# Patient Record
Sex: Female | Born: 1975 | Race: Asian | Hispanic: No | Marital: Married | Smoking: Never smoker
Health system: Southern US, Community
[De-identification: ages and names within clinical notes are randomized; demographics above are authoritative.]

## PROBLEM LIST (undated history)

## (undated) DIAGNOSIS — F32A Depression, unspecified: Secondary | ICD-10-CM

## (undated) DIAGNOSIS — E785 Hyperlipidemia, unspecified: Secondary | ICD-10-CM

## (undated) DIAGNOSIS — F419 Anxiety disorder, unspecified: Secondary | ICD-10-CM

## (undated) DIAGNOSIS — E7800 Pure hypercholesterolemia, unspecified: Secondary | ICD-10-CM

## (undated) DIAGNOSIS — K219 Gastro-esophageal reflux disease without esophagitis: Secondary | ICD-10-CM

## (undated) DIAGNOSIS — E119 Type 2 diabetes mellitus without complications: Secondary | ICD-10-CM

## (undated) HISTORY — DX: Depression, unspecified: F32.A

## (undated) HISTORY — DX: Hyperlipidemia, unspecified: E78.5

## (undated) HISTORY — DX: Gastro-esophageal reflux disease without esophagitis: K21.9

## (undated) HISTORY — DX: Pure hypercholesterolemia, unspecified: E78.00

## (undated) HISTORY — DX: Anxiety disorder, unspecified: F41.9

## (undated) HISTORY — DX: Type 2 diabetes mellitus without complications: E11.9

## (undated) HISTORY — PX: NO PAST SURGERIES: SHX2092

## (undated) HISTORY — PX: OTHER SURGICAL HISTORY: SHX169

---

## 2021-03-13 LAB — HEPATITIS B SURFACE ANTIGEN

## 2021-03-13 LAB — HM HEPATITIS C SCREENING LAB: HM Hepatitis Screen: NEGATIVE

## 2021-03-13 LAB — HM HIV SCREENING LAB: HM HIV Screening: NEGATIVE

## 2021-04-11 ENCOUNTER — Telehealth: Payer: Self-pay

## 2021-04-11 NOTE — Telephone Encounter (Signed)
Refugee from [place]. Language Pashto. Referred from [facility]   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  

## 2021-04-11 NOTE — Progress Notes (Signed)
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

## 2021-04-15 ENCOUNTER — Ambulatory Visit: Payer: Self-pay

## 2021-04-15 DIAGNOSIS — R7612 Nonspecific reaction to cell mediated immunity measurement of gamma interferon antigen response without active tuberculosis: Secondary | ICD-10-CM | POA: Insufficient documentation

## 2021-04-15 NOTE — Progress Notes (Signed)
The information in this document was obtained and reviewed during phone interview with pt.  Pt unsure of ht and wt.  Not using birth control. Is sexually active. She does not want pregnancy; her husband desires for her to get pregnant.  Currently not taking any medications.  Case worker [employer] at [phone]. Pt requests help with getting in touch with case worker about transportation for chest x-ray. Caseworker states they will arrange for transportation and interpreter. Chest x-ray ordered.  Desires LTBI medication.

## 2021-04-15 NOTE — Telephone Encounter (Signed)
Phone call to pt with [employer] interpreter.  Epi completed. Chest x-ray ordered. Case worker to arrange transportation. They are interested in LTBI.  See completed Epi document dated 04/15/21.

## 2021-04-16 ENCOUNTER — Other Ambulatory Visit: Payer: Self-pay

## 2021-04-16 ENCOUNTER — Ambulatory Visit
Admission: RE | Admit: 2021-04-16 | Discharge: 2021-04-16 | Disposition: A | Discharge status: Home / Self Care | Payer: Medicaid Other | Source: Ambulatory Visit | Attending: Family Medicine | Admitting: Family Medicine

## 2021-04-16 DIAGNOSIS — R7612 Nonspecific reaction to cell mediated immunity measurement of gamma interferon antigen response without active tuberculosis: Secondary | ICD-10-CM

## 2021-04-28 ENCOUNTER — Telehealth: Payer: Self-pay

## 2021-04-28 NOTE — Telephone Encounter (Signed)
Phone call to pt with Pashto interpreter with language line. Pt counseled regarding Dr. Stafford Blas review of chest x-ray and order to proceed with LTBI.  Pt to bring any current medications with her to LTBI start visit. As of 04/28/21, not currently taking any medications.  To prevent pregnancy, use condoms with all sex.  LTBI start appt scheduled for 05/13/21.   Case worker to arrange for transportation. Needs Pashto interpreter.

## 2021-04-28 NOTE — Telephone Encounter (Signed)
Caseworker confirmed he would arrange transportation for 05/13/21.

## 2021-04-30 ENCOUNTER — Other Ambulatory Visit: Payer: Self-pay

## 2021-04-30 NOTE — Progress Notes (Signed)
Positive Tspot 03/14/21 Negative HIV 03/13/21 EPI 04/15/21 CXT without Active TB 04/16/21  No meds No significant med hx- painful periods reported (care everywhere)   Rifampin 600mg  x 4 months per [facility]

## 2021-05-03 ENCOUNTER — Ambulatory Visit: Payer: Medicaid Other | Admitting: Internal Medicine

## 2021-05-03 ENCOUNTER — Encounter: Payer: Self-pay | Admitting: Internal Medicine

## 2021-05-03 ENCOUNTER — Other Ambulatory Visit: Payer: Self-pay

## 2021-05-03 VITALS — BP 134/92 | HR 74 | Temp 96.8°F | Ht 62.0 in | Wt 190.4 lb

## 2021-05-03 DIAGNOSIS — N920 Excessive and frequent menstruation with regular cycle: Secondary | ICD-10-CM

## 2021-05-03 DIAGNOSIS — I159 Secondary hypertension, unspecified: Secondary | ICD-10-CM

## 2021-05-03 DIAGNOSIS — N946 Dysmenorrhea, unspecified: Secondary | ICD-10-CM

## 2021-05-03 MED ORDER — IBUPROFEN 600 MG PO TABS
600.0000 mg | ORAL_TABLET | Freq: Three times a day (TID) | ORAL | 0 refills | Status: DC | PRN
Start: 2021-05-03 — End: 2021-11-15

## 2021-05-03 NOTE — Addendum Note (Signed)
Addended by: Aundria Mems AHMAD on: 05/03/2021 12:46 PM   Modules accepted: Orders

## 2021-05-03 NOTE — Progress Notes (Signed)
New Patient Office Visit  Subjective:  Patient ID: Kirsten Mccann, female    DOB: Mar 21, 1976  Age: 45 y.o. MRN: [id number]  CC:  Chief Complaint  Patient presents with   Abdominal Pain    Pt reports sharp abdominal pain and bleeding    Tailbone Pain    HPI Kirsten Mccann presents for new patient evaluation today she c/o chronic lower abd and back pain since she delivered her last baby 7 years ago which may have been a complicated delivery. C/o painful menstruation for which she was prescribed a Rx to which she had an allergic reaction. Also c/o menorrhagia with clots.  Past Medical History:  Diagnosis Date   High cholesterol     Past Surgical History:  Procedure Laterality Date   denies      History reviewed. No pertinent family history.  Social History   Socioeconomic History   Marital status: Married    Spouse name: Not on file   Number of children: Not on file   Years of education: Not on file   Highest education level: Not on file  Occupational History   Not on file  Tobacco Use   Smoking status: Never   Smokeless tobacco: Never  Vaping Use   Vaping Use: Never used  Substance and Sexual Activity   Alcohol use: Never   Drug use: Never   Sexual activity: Yes    Birth control/protection: None  Other Topics Concern   Not on file  Social History Narrative   Not on file   Social Determinants of Health   Financial Resource Strain: Not on file  Food Insecurity: Not on file  Transportation Needs: Not on file  Physical Activity: Not on file  Stress: Not on file  Social Connections: Not on file  Intimate Partner Violence: Not on file    ROS Review of Systems  Constitutional: Negative.   HENT: Negative.    Eyes: Negative.   Respiratory: Negative.    Cardiovascular: Negative.   Gastrointestinal: Negative.   Endocrine: Negative.   Genitourinary:  Positive for menstrual problem and pelvic pain. Negative for dyspareunia and vaginal bleeding.   Musculoskeletal: Negative.   Skin: Negative.   Neurological: Negative.   Psychiatric/Behavioral: Negative.     Objective:   Kirsten Mccann Vitals: BP (!) 134/92 (BP Location: Left Arm, Patient Position: Sitting, Cuff Size: Normal)   Pulse 74   Temp (!) 96.8 F (36 C)   Ht 5\' 2"  (1.575 m)   Wt 190 lb 6.4 oz (86.4 kg)   LMP 04/05/2021 (Approximate) Comment: normal  SpO2 98%   BMI 34.82 kg/m   Physical Exam Constitutional:      Appearance: She is obese. She is not ill-appearing.  HENT:     Head: Normocephalic.  Cardiovascular:     Rate and Rhythm: Normal rate and regular rhythm.     Heart sounds: Normal heart sounds. No murmur heard. Pulmonary:     Effort: Pulmonary effort is normal.     Breath sounds: Normal breath sounds.  Abdominal:     General: Abdomen is protuberant. Bowel sounds are normal. There is no distension.     Palpations: Abdomen is soft.     Tenderness: There is abdominal tenderness in the suprapubic area. There is no rebound.  Skin:    General: Skin is warm and dry.  Neurological:     General: No focal deficit present.  Psychiatric:        Behavior: Behavior normal.    Assessment &  Plan:  Refer to Obgyn, rx nsaids and order labs Problem List Items Addressed This Visit   None   No outpatient encounter medications on file as of 05/03/2021.   No facility-administered encounter medications on file as of 05/03/2021.    Follow-up: No follow-ups on file.   Luna Fuse, MD

## 2021-05-05 ENCOUNTER — Other Ambulatory Visit: Payer: Self-pay | Admitting: Internal Medicine

## 2021-05-06 LAB — CBC WITH DIFFERENTIAL/PLATELET
Basophils Absolute: 0 10*3/uL (ref 0.0–0.2)
Basos: 1 %
EOS (ABSOLUTE): 0.1 10*3/uL (ref 0.0–0.4)
Eos: 1 %
Hematocrit: 37.5 % (ref 34.0–46.6)
Hemoglobin: 12.6 g/dL (ref 11.1–15.9)
Immature Grans (Abs): 0 10*3/uL (ref 0.0–0.1)
Immature Granulocytes: 0 %
Lymphocytes Absolute: 2.2 10*3/uL (ref 0.7–3.1)
Lymphs: 40 %
MCH: 28.2 pg (ref 26.6–33.0)
MCHC: 33.6 g/dL (ref 31.5–35.7)
MCV: 84 fL (ref 79–97)
Monocytes Absolute: 0.3 10*3/uL (ref 0.1–0.9)
Monocytes: 5 %
Neutrophils Absolute: 3 10*3/uL (ref 1.4–7.0)
Neutrophils: 53 %
Platelets: 271 10*3/uL (ref 150–450)
RBC: 4.47 x10E6/uL (ref 3.77–5.28)
RDW: 12 % (ref 11.7–15.4)
WBC: 5.6 10*3/uL (ref 3.4–10.8)

## 2021-05-06 LAB — COMPREHENSIVE METABOLIC PANEL
ALT: 13 IU/L (ref 0–32)
AST: 17 IU/L (ref 0–40)
Albumin/Globulin Ratio: 1.6 (ref 1.2–2.2)
Albumin: 4.5 g/dL (ref 3.8–4.8)
Alkaline Phosphatase: 65 IU/L (ref 44–121)
BUN/Creatinine Ratio: 15 (ref 9–23)
BUN: 9 mg/dL (ref 6–24)
Bilirubin Total: <0.2 mg/dL (ref 0.0–1.2)
CO2: 23 mmol/L (ref 20–29)
Calcium: 9.2 mg/dL (ref 8.7–10.2)
Chloride: 100 mmol/L (ref 96–106)
Creatinine, Ser: 0.6 mg/dL (ref 0.57–1.00)
Globulin, Total: 2.8 g/dL (ref 1.5–4.5)
Glucose: 261 mg/dL — ABNORMAL HIGH (ref 70–99)
Potassium: 4.5 mmol/L (ref 3.5–5.2)
Sodium: 134 mmol/L (ref 134–144)
Total Protein: 7.3 g/dL (ref 6.0–8.5)
eGFR: 114 mL/min/{1.73_m2} (ref >59)

## 2021-05-06 LAB — PT AND PTT
INR: 0.9 (ref 0.9–1.2)
Prothrombin Time: 9.7 s (ref 9.1–12.0)
aPTT: 25 s (ref 24–33)

## 2021-05-06 LAB — LIPID PANEL W/O CHOL/HDL RATIO
Cholesterol, Total: 240 mg/dL — ABNORMAL HIGH (ref 100–199)
HDL: 28 mg/dL — ABNORMAL LOW (ref >39)
LDL Chol Calc (NIH): 89 mg/dL (ref 0–99)
Triglycerides: 743 mg/dL (ref 0–149)
VLDL Cholesterol Cal: 123 mg/dL — ABNORMAL HIGH (ref 5–40)

## 2021-05-06 LAB — FSH/LH
FSH: 4.8 m[IU]/mL
LH: 5 m[IU]/mL

## 2021-05-06 LAB — TSH: TSH: 0.88 u[IU]/mL (ref 0.450–4.500)

## 2021-05-06 LAB — T4, FREE: Free T4: 1 ng/dL (ref 0.82–1.77)

## 2021-05-13 ENCOUNTER — Other Ambulatory Visit: Payer: Self-pay

## 2021-05-13 ENCOUNTER — Ambulatory Visit (LOCAL_COMMUNITY_HEALTH_CENTER): Payer: Medicaid Other

## 2021-05-13 VITALS — Wt 188.5 lb

## 2021-05-13 DIAGNOSIS — R7612 Nonspecific reaction to cell mediated immunity measurement of gamma interferon antigen response without active tuberculosis: Secondary | ICD-10-CM | POA: Diagnosis not present

## 2021-05-13 MED ORDER — RIFAMPIN 300 MG PO CAPS: 600.0000 mg | ORAL_CAPSULE | Freq: Every day | ORAL | 0 refills | Status: AC

## 2021-05-13 NOTE — Progress Notes (Signed)
In Nurse Clinic with husband for TB med start /LTBI / Rifampin #1. Lang line Pashto interpreter (ID [phone]) via phone used today.  LTBI vs Active TB explained . Rifampin med info sheet reviewed and explained to pt to contact [facility] TB coord. Or Efrain Sella, RN with concerns / questions/side effects. Contact cards for Efrain Sella, RN and Phineas Real, RN given to pt and husband.  Pt reports recurrent abd pain that is worse during period, but experiences it in a milder form when not on period. Has appt scheduled with provider on 05/19/2021 for evaluation. Reports taking ibuprofen and indocin for pain management. Denies taking any other meds. On no bcm and advised against preg while taking Rifampin. Condoms given.   - Rifampin 300 mg #60 dispensed today per order by Ralene Bathe, MD dated 04/30/2021 with instructions to take 2 pills daily by mouth for 4 months. To lab for CBC with diff and LFT's.   - Husband reports that caseworker who has been helping them will no longer be able to assist them any longer. Caseworker Kizzie Fantasia [phone]. Explained to pt and husband that [facility] will help to arrange transportation for next TB med appt , scheduled for 06/11/2021 at 9 am. RN placed phone call to caseworker Delana Meyer [place]) who states she will arrange transportation for pt and husband now for their return home. RN walked pt to [facility] lobby on ground floor for them to wait for their ride.   Plan to fax PCP letter (with TB med start) to  [facility] (fax [phone]) (phone [phone]). Jerel Shepherd, RN  05/14/2021  PCP letter faxed successfully to [facility]. Efrain Sella, RN supervisor plans to contact pt and husband before next appt (scheduled 06/11/2021 at 9 am) with transportation details for visit.  -Plan to offer pt Hep A Vaccine at next visit. Per lab result review, Hep B surface Antibody reactive and No Hep B vaccine indicated. Per [facility] review, no documentation of Hep A vaccine. Pt meets  criteria for state supplied Hep A vaccine. Jerel Shepherd, RN

## 2021-05-14 LAB — CBC WITH DIFFERENTIAL/PLATELET
Basophils Absolute: 0.1 10*3/uL (ref 0.0–0.2)
Basos: 1 %
EOS (ABSOLUTE): 0 10*3/uL (ref 0.0–0.4)
Eos: 1 %
Hematocrit: 38 % (ref 34.0–46.6)
Hemoglobin: 12.9 g/dL (ref 11.1–15.9)
Immature Grans (Abs): 0 10*3/uL (ref 0.0–0.1)
Immature Granulocytes: 0 %
Lymphocytes Absolute: 2.5 10*3/uL (ref 0.7–3.1)
Lymphs: 37 %
MCH: 28.1 pg (ref 26.6–33.0)
MCHC: 33.9 g/dL (ref 31.5–35.7)
MCV: 83 fL (ref 79–97)
Monocytes Absolute: 0.4 10*3/uL (ref 0.1–0.9)
Monocytes: 5 %
Neutrophils Absolute: 3.9 10*3/uL (ref 1.4–7.0)
Neutrophils: 56 %
Platelets: 282 10*3/uL (ref 150–450)
RBC: 4.59 x10E6/uL (ref 3.77–5.28)
RDW: 12.3 % (ref 11.7–15.4)
WBC: 6.9 10*3/uL (ref 3.4–10.8)

## 2021-05-14 LAB — HEPATIC FUNCTION PANEL
ALT: 14 IU/L (ref 0–32)
AST: 15 IU/L (ref 0–40)
Albumin: 4.5 g/dL (ref 3.8–4.8)
Alkaline Phosphatase: 54 IU/L (ref 44–121)
Bilirubin Total: 0.3 mg/dL (ref 0.0–1.2)
Bilirubin, Direct: <0.1 mg/dL (ref 0.00–0.40)
Total Protein: 7.4 g/dL (ref 6.0–8.5)

## 2021-05-17 ENCOUNTER — Ambulatory Visit: Payer: Medicaid Other | Admitting: Internal Medicine

## 2021-05-18 NOTE — Progress Notes (Deleted)
    System, Provider Not In   No chief complaint on file.   HPI:      Ms. Kirsten Mccann is a 45 y.o. No obstetric history on file. whose LMP was Patient's last menstrual period was 05/10/2021 (within days)., presents today for NP eval    referreed by PCP    Patient Active Problem List   Diagnosis Date Noted   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  Social History   Socioeconomic History   Marital status: Married    Spouse name: Not on file   Number of children: Not on file   Years of education: Not on file   Highest education level: Not on file  Occupational History   Not on file  Tobacco Use   Smoking status: Never   Smokeless tobacco: Never  Vaping Use   Vaping Use: Never used  Substance and Sexual Activity   Alcohol use: Never   Drug use: Never   Sexual activity: Yes    Birth control/protection: None    Comment: advised to use condoms/condoms given  Other Topics Concern   Not on file  Social History Narrative   Not on file   Social Determinants of Health   Financial Resource Strain: Not on file  Food Insecurity: Not on file  Transportation Needs: Not on file  Physical Activity: Not on file  Stress: Not on file  Social Connections: Not on file  Intimate Partner Violence: Not on file    Outpatient Medications Prior to Visit  Medication Sig Dispense Refill   ibuprofen (ADVIL) 600 MG tablet Take 1 tablet (600 mg total) by mouth every 8 (eight) hours as needed for cramping (menstrual pain). 60 tablet 0   rifampin (RIFADIN) 300 MG capsule Take 2 capsules (600 mg total) by mouth daily. 60 capsule 0   No facility-administered medications prior to visit.      ROS:  Review of Systems BREAST: No symptoms   OBJECTIVE:   Vitals:  LMP 05/10/2021 (Within Days)   Physical Exam  Results: No results found for this or any previous visit (from the  past 24 hour(s)).   Assessment/Plan: No diagnosis found.    No orders of the defined types were placed in this encounter.     No follow-ups on file.  Gidget Quizhpi B. Song Garris, PA-C 05/18/2021 6:39 PM

## 2021-05-19 ENCOUNTER — Encounter: Payer: Medicaid Other | Admitting: Obstetrics and Gynecology

## 2021-05-20 ENCOUNTER — Encounter: Payer: Medicaid Other | Admitting: Obstetrics and Gynecology

## 2021-05-23 ENCOUNTER — Telehealth: Payer: Self-pay

## 2021-05-23 NOTE — Telephone Encounter (Signed)
TC from patient re: Rifampin side effects.  Patient's husband explained that he and his wife are still having stomach ache, dizziness, headache and can't eat anything because of the Rifampin even after taking medication with food.  Denies jaundice, vomiting and diarrhea. States she is having painful periods d/t Rifampin. TB RN consulted with Dr. Alvester Morin.  Stomach ache and other complaints could be from patient's new environment, different diet, previous c/o form painful periods when they arrived a few months, etc.  They could also have a virus. Patient c/o stomach bloating and can't eat anything.  Taxi arranged for patient and husband to come in for labs.  If labs are normal will start INH x 6 months for LTBI. Patient verbalized understanding and agrees with plan Richmond Campbell, RN

## 2021-05-26 ENCOUNTER — Other Ambulatory Visit (LOCAL_COMMUNITY_HEALTH_CENTER): Payer: Self-pay

## 2021-05-26 ENCOUNTER — Other Ambulatory Visit: Payer: Medicaid Other

## 2021-05-26 DIAGNOSIS — R7612 Nonspecific reaction to cell mediated immunity measurement of gamma interferon antigen response without active tuberculosis: Secondary | ICD-10-CM

## 2021-05-26 NOTE — Progress Notes (Signed)
Patient in clinic this afternoon for CBC and LFTs. Patient and spouse are still complaining of abdominal pain, unable to eat, headache and patient is c/o painful periods and having 2 periods this month. Will draw labs and call patient back with results.   Patient's husband requests a GYN referral for the patient and her painful periods. They also request a vaccine appt for January.  Richmond Campbell, RN

## 2021-05-27 LAB — CBC WITH DIFFERENTIAL/PLATELET
Basophils Absolute: 0 10*3/uL (ref 0.0–0.2)
Basos: 1 %
EOS (ABSOLUTE): 0.1 10*3/uL (ref 0.0–0.4)
Eos: 1 %
Hematocrit: 39.6 % (ref 34.0–46.6)
Hemoglobin: 13 g/dL (ref 11.1–15.9)
Immature Grans (Abs): 0 10*3/uL (ref 0.0–0.1)
Immature Granulocytes: 0 %
Lymphocytes Absolute: 2.4 10*3/uL (ref 0.7–3.1)
Lymphs: 37 %
MCH: 27.1 pg (ref 26.6–33.0)
MCHC: 32.8 g/dL (ref 31.5–35.7)
MCV: 83 fL (ref 79–97)
Monocytes Absolute: 0.4 10*3/uL (ref 0.1–0.9)
Monocytes: 6 %
Neutrophils Absolute: 3.5 10*3/uL (ref 1.4–7.0)
Neutrophils: 55 %
Platelets: 289 10*3/uL (ref 150–450)
RBC: 4.8 x10E6/uL (ref 3.77–5.28)
RDW: 12.1 % (ref 11.7–15.4)
WBC: 6.4 10*3/uL (ref 3.4–10.8)

## 2021-05-27 LAB — HEPATIC FUNCTION PANEL
ALT: 12 IU/L (ref 0–32)
AST: 13 IU/L (ref 0–40)
Albumin: 4.5 g/dL (ref 3.8–4.8)
Alkaline Phosphatase: 61 IU/L (ref 44–121)
Bilirubin Total: <0.2 mg/dL (ref 0.0–1.2)
Bilirubin, Direct: 0.08 mg/dL (ref 0.00–0.40)
Total Protein: 7.7 g/dL (ref 6.0–8.5)

## 2021-05-30 ENCOUNTER — Telehealth: Payer: Self-pay

## 2021-06-02 NOTE — Telephone Encounter (Signed)
TC with patient.  Informed patient labs were normal. Patient states she doesn't feel well. Explained Rifampin is not the cause of not feeling well. Offered INH x 6 months to patient.  She declines LTBI tx at this time.  Husband and patient  feel that he and his wife need to get some other health issues worked out first. Advice worker to call TB RN if changes mind. Richmond Campbell, RN

## 2021-08-15 ENCOUNTER — Telehealth: Payer: Self-pay

## 2021-08-16 ENCOUNTER — Other Ambulatory Visit: Payer: Self-pay

## 2021-08-16 ENCOUNTER — Ambulatory Visit: Payer: Medicaid Other | Admitting: Internal Medicine

## 2021-08-16 ENCOUNTER — Encounter: Payer: Self-pay | Admitting: Adult Health

## 2021-08-16 VITALS — BP 120/80 | HR 67 | Temp 98.2°F | Ht 64.0 in | Wt 181.6 lb

## 2021-08-16 DIAGNOSIS — E782 Mixed hyperlipidemia: Secondary | ICD-10-CM

## 2021-08-16 DIAGNOSIS — R739 Hyperglycemia, unspecified: Secondary | ICD-10-CM

## 2021-08-16 NOTE — Progress Notes (Signed)
? ?Established Patient Office Visit ? ?Subjective:  ?Patient ID: Barabara Motz, female    DOB: August 20, 1975  Age: 46 y.o. MRN: [id number] ? ?CC:  ?Chief Complaint  ?Patient presents with  ? Follow-up  ?  Pt reports severe stomach pain before starting her period and after. Pain has been going on for over a year.They are here today because of their abnormal lab.   ? ? ?HPI ?Voncile Zimmermann presents for lab results but failed to have tests done.  ? ?Past Medical History:  ?Diagnosis Date  ? High cholesterol   ? ? ?Past Surgical History:  ?Procedure Laterality Date  ? denies    ? ? ?History reviewed. No pertinent family history. ? ?Social History  ? ?Socioeconomic History  ? Marital status: Married  ?  Spouse name: Not on file  ? Number of children: Not on file  ? Years of education: Not on file  ? Highest education level: Not on file  ?Occupational History  ? Not on file  ?Tobacco Use  ? Smoking status: Never  ? Smokeless tobacco: Never  ?Vaping Use  ? Vaping Use: Never used  ?Substance and Sexual Activity  ? Alcohol use: Never  ? Drug use: Never  ? Sexual activity: Yes  ?  Birth control/protection: None  ?  Comment: advised to use condoms/condoms given  ?Other Topics Concern  ? Not on file  ?Social History Narrative  ? Not on file  ? ?Social Determinants of Health  ? ?Financial Resource Strain: Not on file  ?Food Insecurity: Not on file  ?Transportation Needs: Not on file  ?Physical Activity: Not on file  ?Stress: Not on file  ?Social Connections: Not on file  ?Intimate Partner Violence: Not on file  ? ? ?Outpatient Medications Prior to Visit  ?Medication Sig Dispense Refill  ? ibuprofen (ADVIL) 600 MG tablet Take 1 tablet (600 mg total) by mouth every 8 (eight) hours as needed for cramping (menstrual pain). 60 tablet 0  ? ?No facility-administered medications prior to visit.  ? ? ?No Known Allergies ? ?ROS ?Review of Systems ? ?  ?Objective:  ?  ?Physical Exam ?Constitutional:   ?   Appearance: Normal appearance.  ?HENT:  ?    Head: Normocephalic.  ?Cardiovascular:  ?   Rate and Rhythm: Normal rate and regular rhythm.  ?   Heart sounds: Normal heart sounds. No murmur heard. ?Pulmonary:  ?   Effort: No respiratory distress.  ?   Breath sounds: Normal breath sounds.  ?Neurological:  ?   Mental Status: She is alert.  ? ? ?BP 120/80 (BP Location: Left Arm, Patient Position: Sitting, Cuff Size: Normal)   Pulse 67   Temp 98.2 ?F (36.8 ?C) (Oral)   Ht '5\' 4"'  (1.626 m)   Wt 181 lb 9.6 oz (82.4 kg)   SpO2 98%   BMI 31.17 kg/m?  ?Wt Readings from Last 3 Encounters:  ?08/16/21 181 lb 9.6 oz (82.4 kg)  ?05/13/21 188 lb 8 oz (85.5 kg)  ?05/03/21 190 lb 6.4 oz (86.4 kg)  ? ? ? ?Health Maintenance Due  ?Topic Date Due  ? COVID-19 Vaccine (1) Never done  ? PAP SMEAR-Modifier  Never done  ? ? ?There are no preventive care reminders to display for this patient. ? ?Lab Results  ?Component Value Date  ? TSH 0.880 05/05/2021  ? ?Lab Results  ?Component Value Date  ? WBC 6.4 05/26/2021  ? HGB 13.0 05/26/2021  ? HCT 39.6 05/26/2021  ? MCV  83 05/26/2021  ? PLT 289 05/26/2021  ? ?Lab Results  ?Component Value Date  ? NA 134 05/05/2021  ? K 4.5 05/05/2021  ? CO2 23 05/05/2021  ? GLUCOSE 261 (H) 05/05/2021  ? BUN 9 05/05/2021  ? CREATININE 0.60 05/05/2021  ? BILITOT <0.2 05/26/2021  ? ALKPHOS 61 05/26/2021  ? AST 13 05/26/2021  ? ALT 12 05/26/2021  ? PROT 7.7 05/26/2021  ? ALBUMIN 4.5 05/26/2021  ? CALCIUM 9.2 05/05/2021  ? EGFR 114 05/05/2021  ? ?Lab Results  ?Component Value Date  ? CHOL 240 (H) 05/05/2021  ? ?Lab Results  ?Component Value Date  ? HDL 28 (L) 05/05/2021  ? ?Lab Results  ?Component Value Date  ? [place] 89 05/05/2021  ? ?Lab Results  ?Component Value Date  ? TRIG 743 ([place]) 05/05/2021  ? ?No results found for: CHOLHDL ?No results found for: HGBA1C ? ?  ?Assessment & Plan:  ?Given low cholesterol  handout ?Recheck cholesterol and order a1c ?Problem List Items Addressed This Visit   ?None ? ? ?No orders of the defined types were placed in this  encounter. ? ? ?Follow-up: No follow-ups on file.  ? ? ?Volanda Napoleon, MD ?

## 2021-09-02 ENCOUNTER — Other Ambulatory Visit: Payer: Self-pay | Admitting: Internal Medicine

## 2021-09-03 LAB — LIPID PANEL W/O CHOL/HDL RATIO
Cholesterol, Total: 183 mg/dL (ref 100–199)
HDL: 34 mg/dL — ABNORMAL LOW (ref >39)
LDL Chol Calc (NIH): 117 mg/dL — ABNORMAL HIGH (ref 0–99)
Triglycerides: 181 mg/dL — ABNORMAL HIGH (ref 0–149)
VLDL Cholesterol Cal: 32 mg/dL (ref 5–40)

## 2021-09-03 LAB — HGB A1C W/O EAG: Hgb A1c MFr Bld: 7.8 % — ABNORMAL HIGH (ref 4.8–5.6)

## 2021-09-13 ENCOUNTER — Ambulatory Visit: Payer: Medicaid Other | Admitting: Internal Medicine

## 2021-09-13 ENCOUNTER — Encounter: Payer: Self-pay | Admitting: Internal Medicine

## 2021-09-13 VITALS — BP 132/81 | HR 97 | Temp 98.5°F | Ht 62.32 in | Wt 191.8 lb

## 2021-09-13 DIAGNOSIS — B078 Other viral warts: Secondary | ICD-10-CM

## 2021-09-13 DIAGNOSIS — N946 Dysmenorrhea, unspecified: Secondary | ICD-10-CM

## 2021-09-13 DIAGNOSIS — Z227 Latent tuberculosis: Secondary | ICD-10-CM

## 2021-09-13 DIAGNOSIS — E1165 Type 2 diabetes mellitus with hyperglycemia: Secondary | ICD-10-CM

## 2021-09-13 MED ORDER — METFORMIN HCL 500 MG PO TABS: 500.0000 mg | ORAL_TABLET | Freq: Two times a day (BID) | ORAL | 3 refills | Status: DC

## 2021-09-13 NOTE — Progress Notes (Signed)
[facility]  ? ?Internal MEDICINE  ?Office Visit Note ? ?Patient Name: Kirsten Mccann ? [id number]  ?[id number] ? ?Date of Service: 09/13/2021 ? ?Chief Complaint  ?Patient presents with  ? Follow-up  ?  Blood sugar. Patient reports to have fever twice a day, and full body ache and knee pain  ? ? ?HPI ? ?Patient is here with her husband and they refuses from the family's and cannot speak [place] Arabic or Urdu ?There is a communication barrier, most of the conversation is done by her husband she stayed quietly in room. ?Patient is here to discuss her lab work her blood sugar is elevated with a hemoglobin A1c of 7.8 ?Her triglycerides are also mildly elevated, she has 7 kids 4 of them are back in [place], 3 of them  are here  ?Her husband is unable to find a job. ?Patient is also complains of dysmenorrhea especially before cycles she gets a lot of cramping she does have an appointment with OB/GYN at [facility]. ?Patient is also has some multiple growths over her hands  one of them is around the nail and is very painful ?Most of the time she stays home patient seems to be depressed ?She did not finish her treatment for her latent TB ?Current Medication: ?Outpatient Encounter Medications as of 09/13/2021  ?Medication Sig  ? metFORMIN (GLUCOPHAGE) 500 MG tablet Take 1 tablet (500 mg total) by mouth 2 (two) times daily with a meal. Before breakfast and before dinner  ? ibuprofen (ADVIL) 600 MG tablet Take 1 tablet (600 mg total) by mouth every 8 (eight) hours as needed for cramping (menstrual pain).  ? ?No facility-administered encounter medications on file as of 09/13/2021.  ? ? ?Surgical History: ?Past Surgical History:  ?Procedure Laterality Date  ? denies    ? ? ?Medical History: ?Past Medical History:  ?Diagnosis Date  ? High cholesterol   ? ? ?Family History: ?History reviewed. No pertinent family history. ? ?Social History  ? ?Socioeconomic History  ? Marital status: Married  ?  Spouse name: Not on file  ? Number of  children: Not on file  ? Years of education: Not on file  ? Highest education level: Not on file  ?Occupational History  ? Not on file  ?Tobacco Use  ? Smoking status: Never  ? Smokeless tobacco: Never  ?Vaping Use  ? Vaping Use: Never used  ?Substance and Sexual Activity  ? Alcohol use: Never  ? Drug use: Never  ? Sexual activity: Yes  ?  Birth control/protection: None  ?  Comment: advised to use condoms/condoms given  ?Other Topics Concern  ? Not on file  ?Social History Narrative  ? Not on file  ? ?Social Determinants of Health  ? ?Financial Resource Strain: Not on file  ?Food Insecurity: Not on file  ?Transportation Needs: Not on file  ?Physical Activity: Not on file  ?Stress: Not on file  ?Social Connections: Not on file  ?Intimate Partner Violence: Not on file  ? ? ? ? ?Review of Systems  ?Constitutional:  Negative for chills, fatigue and unexpected weight change.  ?HENT:  Positive for postnasal drip. Negative for congestion, rhinorrhea, sneezing and sore throat.   ?Eyes:  Negative for redness.  ?Respiratory:  Negative for cough, chest tightness and shortness of breath.   ?Cardiovascular:  Negative for chest pain and palpitations.  ?Gastrointestinal:  Negative for abdominal pain, constipation, diarrhea, nausea and vomiting.  ?Genitourinary:  Positive for menstrual problem and pelvic pain. Negative for  dysuria and frequency.  ?Musculoskeletal:  Negative for arthralgias, back pain, joint swelling and neck pain.  ?Skin:  Positive for color change and rash.  ?Neurological: Negative.  Negative for tremors and numbness.  ?Hematological:  Negative for adenopathy. Does not bruise/bleed easily.  ?Psychiatric/Behavioral:  Negative for behavioral problems (Depression), sleep disturbance and suicidal ideas. The patient is not nervous/anxious.   ? ?Vital Signs: ?BP 132/81 (BP Location: Left Arm, Patient Position: Sitting, Cuff Size: Normal)   Pulse 97   Temp 98.5 ?F (36.9 ?C) (Oral)   Ht 5' 2.32" (1.583 m)   Wt 191 lb  12.8 oz (87 kg)   SpO2 97%   BMI 34.72 kg/m?  ? ? ?Physical Exam ?Skin: ?   General: Skin is warm.  ?   Comments: Multiple warts over her hand   ? ?Assessment/Plan: ?1. Other viral warts ?Multiple warts over her hand and under the nail which is causing her to have pain patient is to be seen by dermatology for further management and treatment ?- Ambulatory referral to Dermatology ? ?2. Poorly controlled diabetes mellitus ([place]) ?Unable to give directions with the glucometer we will start metformin twice a day patient instructed to limit her sweets and also exercise on a regular basis like walking 30 minutes 5 days of the week ?- metFORMIN (GLUCOPHAGE) 500 MG tablet; Take 1 tablet (500 mg total) by mouth 2 (two) times daily with a meal. Before breakfast and before dinner  Dispense: 180 tablet; Refill: 3 ? ?3. Dysmenorrhea ?This will be managed by Gaspar Cola the patient has an appointment on the 13th ? ?4. LTBI (latent tuberculosis infection) ?Encourage patient to follow-up with health department so she can finish her treatment especially if patient is having low-grade fever in p.m. however she denies having any night sweats or weight loss ? ?General Counseling: Ekaterini verbalizes understanding of the findings of todays visit and agrees with plan of treatment. I have discussed any further diagnostic evaluation that may be needed or ordered today. We also reviewed her medications today. she has been encouraged to call the office with any questions or concerns that should arise related to todays visit. ? ? ? ?Orders Placed This Encounter  ?Procedures  ? Ambulatory referral to Dermatology  ? ? ?Meds ordered this encounter  ?Medications  ? metFORMIN (GLUCOPHAGE) 500 MG tablet  ?  Sig: Take 1 tablet (500 mg total) by mouth 2 (two) times daily with a meal. Before breakfast and before dinner  ?  Dispense:  180 tablet  ?  Refill:  3  ? ? ?Total time spent:25 Minutes ?Time spent includes review of chart, medications, test  results, and follow up plan with the patient.  ? ?[place] Controlled Substance Database was reviewed by me. ? ? ?Dr Lavera Guise ?Internal medicine  ?

## 2021-10-18 ENCOUNTER — Ambulatory Visit: Payer: Medicaid Other | Admitting: Internal Medicine

## 2021-11-01 ENCOUNTER — Encounter: Payer: Self-pay | Admitting: Internal Medicine

## 2021-11-01 ENCOUNTER — Ambulatory Visit: Payer: Medicaid Other | Admitting: Internal Medicine

## 2021-11-01 VITALS — BP 112/79 | HR 74 | Temp 98.3°F | Ht 62.0 in | Wt 179.2 lb

## 2021-11-01 DIAGNOSIS — F4323 Adjustment disorder with mixed anxiety and depressed mood: Secondary | ICD-10-CM

## 2021-11-01 DIAGNOSIS — N946 Dysmenorrhea, unspecified: Secondary | ICD-10-CM

## 2021-11-01 DIAGNOSIS — E1165 Type 2 diabetes mellitus with hyperglycemia: Secondary | ICD-10-CM

## 2021-11-01 MED ORDER — SERTRALINE HCL 50 MG PO TABS: ORAL_TABLET | ORAL | 3 refills | Status: DC

## 2021-11-01 MED ORDER — DAPAGLIFLOZIN PROPANEDIOL 5 MG PO TABS: 5.0000 mg | ORAL_TABLET | Freq: Every day | ORAL | 1 refills | Status: DC

## 2021-11-01 MED ORDER — ACETAMINOPHEN ER 650 MG PO TBCR: EXTENDED_RELEASE_TABLET | ORAL | 1 refills | Status: DC

## 2021-11-01 NOTE — Progress Notes (Signed)
[facility]  [place], [place] [address]  Internal MEDICINE  Office Visit Note  Patient Name: Kirsten Mccann  [id number]  [id number]  Date of Service: 11/10/2021  Chief Complaint  Patient presents with   Abdominal Pain    Patient is having a side effect from taking a medicine, which causes her a stomach pain as a side effect     HPI  Patient is recent immigrant from [place], Is here with her son, there is language barrier. She consents to having a side effect abdominal pain due to metformin intake for her diabetes, patient is able to lose about 10 pounds and has been watching her diet. Patient was also having dysmenorrhea and was seen by OB/GYN most of the test and diagnostic has been negative. Patient also feels feverish in the afternoon there has been a question of TB but according to her husband she was treated. She gives history of aches and pains as well unable to do the depression screening however all the circumstances in her life go along with having situational depression   Current Medication: Outpatient Encounter Medications as of 11/01/2021  Medication Sig   acetaminophen (TYLENOL) 650 MG CR tablet Take one tab po qd prn for fever   dapagliflozin propanediol (FARXIGA) 5 MG TABS tablet Take 1 tablet (5 mg total) by mouth daily before breakfast.   sertraline (ZOLOFT) 50 MG tablet Take one tab po qd for depression at lunch   ibuprofen (ADVIL) 600 MG tablet Take 1 tablet (600 mg total) by mouth every 8 (eight) hours as needed for cramping (menstrual pain).   [DISCONTINUED] metFORMIN (GLUCOPHAGE) 500 MG tablet Take 1 tablet (500 mg total) by mouth 2 (two) times daily with a meal. Before breakfast and before dinner   No facility-administered encounter medications on file as of 11/01/2021.    Surgical History: Past Surgical History:  Procedure Laterality Date   denies      Medical History: Past Medical History:  Diagnosis Date   High cholesterol     Family  History: History reviewed. No pertinent family history.  Social History   Socioeconomic History   Marital status: Married    Spouse name: Not on file   Number of children: Not on file   Years of education: Not on file   Highest education level: Not on file  Occupational History   Not on file  Tobacco Use   Smoking status: Never   Smokeless tobacco: Never  Vaping Use   Vaping Use: Never used  Substance and Sexual Activity   Alcohol use: Never   Drug use: Never   Sexual activity: Yes    Birth control/protection: None    Comment: advised to use condoms/condoms given  Other Topics Concern   Not on file  Social History Narrative   Not on file   Social Determinants of Health   Financial Resource Strain: Not on file  Food Insecurity: Not on file  Transportation Needs: Not on file  Physical Activity: Not on file  Stress: Not on file  Social Connections: Not on file  Intimate Partner Violence: Not on file      Review of Systems  Constitutional:  Positive for fever. Negative for fatigue.  HENT:  Negative for congestion, mouth sores and postnasal drip.   Respiratory:  Negative for cough.   Cardiovascular:  Negative for chest pain.  Genitourinary:  Positive for menstrual problem. Negative for flank pain.  Neurological:  Positive for weakness.  Psychiatric/Behavioral: Negative.     Vital  Signs: BP 112/79 (BP Location: Right Arm, Patient Position: Sitting, Cuff Size: Normal)   Pulse 74   Temp 98.3 F (36.8 C)   Ht 5\' 2"  (1.575 m)   Wt 179 lb 3.2 oz (81.3 kg)   SpO2 98%   BMI 32.78 kg/m    Physical Exam Constitutional:      Appearance: Normal appearance.  HENT:     Head: Normocephalic and atraumatic.     Nose: Nose normal.     Mouth/Throat:     Mouth: Mucous membranes are moist.     Pharynx: No posterior oropharyngeal erythema.  Eyes:     Extraocular Movements: Extraocular movements intact.     Pupils: Pupils are equal, round, and reactive to light.   Cardiovascular:     Pulses: Normal pulses.     Heart sounds: Normal heart sounds.  Pulmonary:     Effort: Pulmonary effort is normal.     Breath sounds: Normal breath sounds.  Neurological:     General: No focal deficit present.     Mental Status: She is alert.  Psychiatric:        Mood and Affect: Mood normal.        Behavior: Behavior normal.       Assessment/Plan: 1. Poorly controlled diabetes mellitus (HCC) Metformin stopped due to side effects we will start continue to watch her diet for sweets and increase physical activity as tolerated - dapagliflozin propanediol (FARXIGA) 5 MG TABS tablet; Take 1 tablet (5 mg total) by mouth daily before breakfast.  Dispense: 90 tablet; Refill: 1  2. Dysmenorrhea Work-up from OB/GYN is negative for any acute pathology patient is started on on Tylenol as needed, avoid Advil - acetaminophen (TYLENOL) 650 MG CR tablet; Take one tab po qd prn for fever  Dispense: 30 tablet; Refill: 1  3. Adjustment reaction with anxiety and depression Patient is in new surroundings, [place] all her family is back in [place], this can lead to anxiety and depression we will start on low-dose Zoloft - sertraline (ZOLOFT) 50 MG tablet; Take one tab po qd for depression at lunch  Dispense: 90 tablet; Refill: 3   General Counseling: Tekeshia verbalizes understanding of the findings of todays visit and agrees with plan of treatment. I have discussed any further diagnostic evaluation that may be needed or ordered today. We also reviewed her medications today. she has been encouraged to call the office with any questions or concerns that should arise related to todays visit.    No orders of the defined types were placed in this encounter.   Meds ordered this encounter  Medications   dapagliflozin propanediol (FARXIGA) 5 MG TABS tablet    Sig: Take 1 tablet (5 mg total) by mouth daily before breakfast.    Dispense:  90 tablet    Refill:   1   acetaminophen (TYLENOL) 650 MG CR tablet    Sig: Take one tab po qd prn for fever    Dispense:  30 tablet    Refill:  1   sertraline (ZOLOFT) 50 MG tablet    Sig: Take one tab po qd for depression at lunch    Dispense:  90 tablet    Refill:  3    Total time spent:30 Minutes Time spent includes review of chart, medications, test results, and follow up plan with the patient.   [place] Controlled Substance Database was reviewed by me.   Dr [place] Internal medicine

## 2021-11-15 ENCOUNTER — Ambulatory Visit: Payer: Medicaid Other | Admitting: Internal Medicine

## 2021-11-15 VITALS — BP 126/73 | HR 52 | Temp 98.1°F

## 2021-11-15 DIAGNOSIS — K219 Gastro-esophageal reflux disease without esophagitis: Secondary | ICD-10-CM

## 2021-11-15 DIAGNOSIS — G8929 Other chronic pain: Secondary | ICD-10-CM

## 2021-11-15 DIAGNOSIS — F341 Dysthymic disorder: Secondary | ICD-10-CM

## 2021-11-15 DIAGNOSIS — E1165 Type 2 diabetes mellitus with hyperglycemia: Secondary | ICD-10-CM

## 2021-11-15 NOTE — Progress Notes (Signed)
[facility] [place], [place] [address]  Internal MEDICINE  Office Visit Note  Patient Name: Kirsten Mccann  [id number]  [id number]  Date of Service: 11/15/2021  Chief Complaint  Patient presents with   Abdominal Pain    Has been getting worse   Depression   Diabetes    HPI  Patient is here with her husband, she is an immigrant from [place]. There is language barrier. Patient has been seen multiple times for abdominal pain which is getting worse, likely due to menstrual cramps however it goes on for the whole month worse around her cycle time she denies any constipation was seen at [facility] and had pelvic exam with biopsy which was negative. Patient also has new onset diabetes was started on metformin which made her abdominal pain and gastritis worse, she was unable to afford any other medications at this time. Patient also was started on Zoloft on previous visit she has been tolerating it well patient does complain of heartburn as well Current Medication: Outpatient Encounter Medications as of 11/15/2021  Medication Sig   sertraline (ZOLOFT) 50 MG tablet Take one tab po qd for depression at lunch   [DISCONTINUED] acetaminophen (TYLENOL) 650 MG CR tablet Take one tab po qd prn for fever   [DISCONTINUED] dapagliflozin propanediol (FARXIGA) 5 MG TABS tablet Take 1 tablet (5 mg total) by mouth daily before breakfast.   [DISCONTINUED] ibuprofen (ADVIL) 600 MG tablet Take 1 tablet (600 mg total) by mouth every 8 (eight) hours as needed for cramping (menstrual pain).   No facility-administered encounter medications on file as of 11/15/2021.    Surgical History: Past Surgical History:  Procedure Laterality Date   denies      Medical History: Past Medical History:  Diagnosis Date   High cholesterol     Family History: No family history on file.  Social History   Socioeconomic History   Marital status: Married    Spouse name: Not on file   Number of children: Not on  file   Years of education: Not on file   Highest education level: Not on file  Occupational History   Not on file  Tobacco Use   Smoking status: Never   Smokeless tobacco: Never  Vaping Use   Vaping Use: Never used  Substance and Sexual Activity   Alcohol use: Never   Drug use: Never   Sexual activity: Yes    Birth control/protection: None    Comment: advised to use condoms/condoms given  Other Topics Concern   Not on file  Social History Narrative   Not on file   Social Determinants of Health   Financial Resource Strain: Not on file  Food Insecurity: Not on file  Transportation Needs: Not on file  Physical Activity: Not on file  Stress: Not on file  Social Connections: Not on file  Intimate Partner Violence: Not on file      Review of Systems  Constitutional:  Negative for fatigue and fever.  HENT:  Negative for congestion, mouth sores and postnasal drip.   Respiratory:  Negative for cough.   Cardiovascular:  Negative for chest pain.  Gastrointestinal:  Positive for abdominal pain and nausea. Negative for diarrhea.  Genitourinary:  Negative for flank pain.  Psychiatric/Behavioral: Negative.     Vital Signs: BP 126/73 (BP Location: Right Arm, Patient Position: Sitting, Cuff Size: Normal)   Pulse (!) 52   Temp 98.1 F (36.7 C) (Oral)    Physical Exam Constitutional:  Appearance: Normal appearance.  HENT:     Head: Normocephalic and atraumatic.     Nose: Nose normal.     Mouth/Throat:     Mouth: Mucous membranes are moist.     Pharynx: No posterior oropharyngeal erythema.  Eyes:     Extraocular Movements: Extraocular movements intact.     Pupils: Pupils are equal, round, and reactive to light.  Cardiovascular:     Pulses: Normal pulses.     Heart sounds: Normal heart sounds.  Pulmonary:     Effort: Pulmonary effort is normal.     Breath sounds: Normal breath sounds.  Abdominal:     General: Bowel sounds are increased.     Tenderness: There is  abdominal tenderness in the suprapubic area and left upper quadrant.  Neurological:     General: No focal deficit present.     Mental Status: She is alert.  Psychiatric:        Mood and Affect: Mood normal.        Behavior: Behavior normal.       Assessment/Plan: 1. Chronic abdominal pain Plan patient continues to have chronic abdominal pain which is getting worse questionable lower dysmenorrhea however we will do CT abdomen and pelvis and treat her afterwards - CT Abdomen Pelvis W Contrast; Future  2. Gastroesophageal reflux disease without esophagitis Samples of omeprazole 20 mg once a day are given to the patient  3. Type II diabetes mellitus with hyperosmolarity, uncontrolled (HCC) We will stop metformin due to side effect of patient is unable to afford any of the medication she will control with lifestyle modification  4. Persistent depressive disorder Continue Zoloft as before  General Counseling: Kirsten Mccann verbalizes understanding of the findings of todays visit and agrees with plan of treatment. I have discussed any further diagnostic evaluation that may be needed or ordered today. We also reviewed her medications today. she has been encouraged to call the office with any questions or concerns that should arise related to todays visit.  Samples of Ibuprofen 400 mg po qd, Omeprazole 20 mg po qd with breakfast  Acetaminophen extra strength one tab at bedtime  #30 day supply    Orders Placed This Encounter  Procedures   CT Abdomen Pelvis W Contrast     Total time spent:30 Minutes Time spent includes review of chart, medications, test results, and follow up plan with the patient.   [place] Controlled Substance Database was reviewed by me.   Dr Lyndon Code Internal medicine

## 2021-11-29 ENCOUNTER — Encounter: Payer: Self-pay | Admitting: Internal Medicine

## 2021-11-29 ENCOUNTER — Ambulatory Visit: Payer: Medicaid Other | Admitting: Internal Medicine

## 2021-11-29 VITALS — BP 120/89 | HR 66 | Temp 97.0°F | Ht 62.0 in | Wt 180.8 lb

## 2021-11-29 DIAGNOSIS — E1165 Type 2 diabetes mellitus with hyperglycemia: Secondary | ICD-10-CM

## 2021-11-29 DIAGNOSIS — G8929 Other chronic pain: Secondary | ICD-10-CM

## 2021-11-29 DIAGNOSIS — F3341 Major depressive disorder, recurrent, in partial remission: Secondary | ICD-10-CM

## 2021-11-29 MED ORDER — ACETAMINOPHEN 500 MG PO TABS: ORAL_TABLET | ORAL | 1 refills | Status: DC

## 2021-11-29 MED ORDER — IBUPROFEN 400 MG PO TABS: ORAL_TABLET | ORAL | 1 refills | Status: DC

## 2021-11-29 MED ORDER — OMEPRAZOLE 20 MG PO CPDR: 20.0000 mg | DELAYED_RELEASE_CAPSULE | Freq: Every day | ORAL | 3 refills | Status: DC

## 2021-11-29 NOTE — Progress Notes (Signed)
[facility]   Internal MEDICINE  Office Visit Note  Patient Name: Kirsten Mccann  [id number]  [id number]  Date of Service: 11/29/2021  Chief Complaint  Patient presents with   Follow-up    Pt reports pain in body Blood sugar: 162 (fasting)    HPI  Patient is here with her husband, she is an immigrant from [place]. There is language barrier. Patient has been seen multiple times for abdominal pain which is getting worse, likely due to menstrual cramps however it goes on for the whole month worse around her cycle time she denies any constipation was seen at [facility] and had pelvic exam with biopsy which was negative. Patient also has new onset diabetes was started on metformin which made her abdominal pain and gastritis worse, she was unable to afford any other medications at this time. Patient also was started on Zoloft on previous visit she has been tolerating it well  Heartburn is better Kirsten Mccann 20 mg once a day she was also given Tylenol and Motrin to be taken alternatively for body aches and pains  Current Medication: Outpatient Encounter Medications as of 11/29/2021  Medication Sig   acetaminophen (TYLENOL) 500 MG tablet Take one tab po qd prn for pain   ibuprofen (ADVIL) 400 MG tablet ONE TAB PO QD AS NEEDED FOR ACHES AND PAIN S   omeprazole (PRILOSEC) 20 MG capsule Take 1 capsule (20 mg total) by mouth daily. For stomach   sertraline (ZOLOFT) 50 MG tablet Take one tab po qd for depression at lunch   No facility-administered encounter medications on file as of 11/29/2021.    Surgical History: Past Surgical History:  Procedure Laterality Date   denies      Medical History: Past Medical History:  Diagnosis Date   High cholesterol     Family History: History reviewed. No pertinent family history.  Social History   Socioeconomic History   Marital status: Married    Spouse name: Not on file   Number of children: Not on file   Years of education: Not on file    Highest education level: Not on file  Occupational History   Not on file  Tobacco Use   Smoking status: Never   Smokeless tobacco: Never  Vaping Use   Vaping Use: Never used  Substance and Sexual Activity   Alcohol use: Never   Drug use: Never   Sexual activity: Yes    Birth control/protection: None    Comment: advised to use condoms/condoms given  Other Topics Concern   Not on file  Social History Narrative   Not on file   Social Determinants of Health   Financial Resource Strain: Not on file  Food Insecurity: Not on file  Transportation Needs: Not on file  Physical Activity: Not on file  Stress: Not on file  Social Connections: Not on file  Intimate Partner Violence: Not on file      Review of Systems  Constitutional:  Negative for chills, fatigue and unexpected weight change.  HENT:  Positive for postnasal drip. Negative for congestion, rhinorrhea, sneezing and sore throat.   Eyes:  Negative for redness.  Respiratory:  Negative for cough, chest tightness and shortness of breath.   Cardiovascular:  Negative for chest pain and palpitations.  Gastrointestinal:  Positive for abdominal pain. Negative for constipation, diarrhea, nausea and vomiting.  Genitourinary:  Negative for dysuria and frequency.  Musculoskeletal:  Negative for arthralgias, back pain, joint swelling and neck pain.  Skin:  Negative  for rash.  Neurological: Negative.  Negative for tremors and numbness.  Hematological:  Negative for adenopathy. Does not bruise/bleed easily.  Psychiatric/Behavioral:  Negative for behavioral problems (Depression), sleep disturbance and suicidal ideas. The patient is not nervous/anxious.     Vital Signs: BP 120/89 (BP Location: Left Arm, Patient Position: Sitting, Cuff Size: Normal)   Pulse 66   Temp (!) 97 F (36.1 C) (Oral)   Ht 5\' 2"  (1.575 m)   Wt 180 lb 12.8 oz (82 kg)   SpO2 98%   BMI 33.07 kg/m    Physical Exam  NOT DONE    Assessment/Plan: 1.  Chronic abdominal pain CT scan of the abdomen and pelvis was ordered however due to lack of communication the patient had missed a call from scheduling will find an interpreter for so patient can have a CT of the abdomen pelvis as patient continues to have chronic abdominal pain Continue omeprazole 20 mg once a day 2. Poorly controlled diabetes mellitus (HCC) Her glucose is climbing again without medication it was elevated today 162 unable to take metformin we will give her samples of Steglatro 5 mg once a day  3. Recurrent major depressive disorder, in partial remission [facility]) Patient had bilateral psychosomatic complaints physical complaints due to depressive disorder patient was instructed to continue take her Zoloft  Patient also has Omeprazole 20 mg, Steglatro 5 mg Tylenol and ibuprofen from [facility] pharmacy   General Counseling: Kirsten Mccann verbalizes understanding of the findings of todays visit and agrees with plan of treatment. I have discussed any further diagnostic evaluation that may be needed or ordered today. We also reviewed her medications today. she has been encouraged to call the office with any questions or concerns that should arise related to todays visit.    No orders of the defined types were placed in this encounter.   Meds ordered this encounter  Medications   omeprazole (PRILOSEC) 20 MG capsule    Sig: Take 1 capsule (20 mg total) by mouth daily. For stomach    Dispense:  90 capsule    Refill:  3   ibuprofen (ADVIL) 400 MG tablet    Sig: ONE TAB PO QD AS NEEDED FOR ACHES AND PAIN S    Dispense:  90 tablet    Refill:  1   acetaminophen (TYLENOL) 500 MG tablet    Sig: Take one tab po qd prn for pain    Dispense:  90 tablet    Refill:  1    Total time spent:30 Minutes Time spent includes review of chart, medications, test results, and follow up plan with the patient.   [place] Controlled Substance Database was reviewed by me.   Dr [facility] Internal  medicine

## 2021-12-04 ENCOUNTER — Ambulatory Visit: Admission: RE | Admit: 2021-12-04 | Payer: Medicaid Other | Source: Ambulatory Visit

## 2021-12-11 ENCOUNTER — Ambulatory Visit: Admission: RE | Admit: 2021-12-11 | Payer: Medicaid Other | Source: Ambulatory Visit

## 2021-12-30 ENCOUNTER — Encounter: Payer: Self-pay | Admitting: Emergency Medicine

## 2021-12-30 ENCOUNTER — Emergency Department
Admission: EM | Admit: 2021-12-30 | Discharge: 2021-12-31 | Disposition: A | Discharge status: Home / Self Care | Payer: Medicaid Other | Attending: Emergency Medicine | Admitting: Emergency Medicine

## 2021-12-30 ENCOUNTER — Emergency Department: Payer: Medicaid Other

## 2021-12-30 DIAGNOSIS — R109 Unspecified abdominal pain: Secondary | ICD-10-CM | POA: Insufficient documentation

## 2021-12-30 DIAGNOSIS — T7840XA Allergy, unspecified, initial encounter: Secondary | ICD-10-CM | POA: Insufficient documentation

## 2021-12-30 DIAGNOSIS — G8929 Other chronic pain: Secondary | ICD-10-CM | POA: Diagnosis not present

## 2021-12-30 DIAGNOSIS — E119 Type 2 diabetes mellitus without complications: Secondary | ICD-10-CM | POA: Diagnosis not present

## 2021-12-30 LAB — COMPREHENSIVE METABOLIC PANEL
ALT: 16 U/L (ref 0–44)
AST: 16 U/L (ref 15–41)
Albumin: 4.2 g/dL (ref 3.5–5.0)
Alkaline Phosphatase: 48 U/L (ref 38–126)
Anion gap: 7 (ref 5–15)
BUN: 19 mg/dL (ref 6–20)
CO2: 22 mmol/L (ref 22–32)
Calcium: 9.2 mg/dL (ref 8.9–10.3)
Chloride: 109 mmol/L (ref 98–111)
Creatinine, Ser: 0.88 mg/dL (ref 0.44–1.00)
GFR, Estimated: >60 mL/min (ref >60)
Glucose, Bld: 116 mg/dL — ABNORMAL HIGH (ref 70–99)
Potassium: 4.2 mmol/L (ref 3.5–5.1)
Sodium: 138 mmol/L (ref 135–145)
Total Bilirubin: 0.4 mg/dL (ref 0.3–1.2)
Total Protein: 7.9 g/dL (ref 6.5–8.1)

## 2021-12-30 LAB — CBC
HCT: 38 % (ref 36.0–46.0)
Hemoglobin: 12.1 g/dL (ref 12.0–15.0)
MCH: 27.1 pg (ref 26.0–34.0)
MCHC: 31.8 g/dL (ref 30.0–36.0)
MCV: 85.2 fL (ref 80.0–100.0)
Platelets: 257 10*3/uL (ref 150–400)
RBC: 4.46 MIL/uL (ref 3.87–5.11)
RDW: 13.1 % (ref 11.5–15.5)
WBC: 6.9 10*3/uL (ref 4.0–10.5)
nRBC: 0 % (ref 0.0–0.2)

## 2021-12-30 LAB — URINALYSIS, ROUTINE W REFLEX MICROSCOPIC
Bilirubin Urine: NEGATIVE
Glucose, UA: NEGATIVE mg/dL
Hgb urine dipstick: NEGATIVE
Ketones, ur: NEGATIVE mg/dL
Leukocytes,Ua: NEGATIVE
Nitrite: NEGATIVE
Protein, ur: NEGATIVE mg/dL
Specific Gravity, Urine: 1.026 (ref 1.005–1.030)
pH: 5 (ref 5.0–8.0)

## 2021-12-30 LAB — LIPASE, BLOOD: Lipase: 28 U/L (ref 11–51)

## 2021-12-30 LAB — POC URINE PREG, ED: Preg Test, Ur: NEGATIVE

## 2021-12-30 MED ORDER — KETOROLAC TROMETHAMINE 30 MG/ML IJ SOLN
30.0000 mg | Freq: Once | INTRAMUSCULAR | Status: AC
  Administered 2021-12-31: 30 mg via INTRAVENOUS
  Filled 2021-12-30: qty 1

## 2021-12-30 MED ORDER — DIPHENHYDRAMINE HCL 50 MG/ML IJ SOLN
25.0000 mg | Freq: Once | INTRAMUSCULAR | Status: AC
  Administered 2021-12-31: 25 mg via INTRAVENOUS
  Filled 2021-12-30: qty 1

## 2021-12-30 NOTE — ED Triage Notes (Signed)
Pt presents via POV with complaints of abdominal pain for the last 6 years that starts before her menses. Pt also complaints of an erythematous rash on her right eye and right arm that started last night. Denies N/V, CP, or SOB.    Interpreter Product/process development scientist) used for triage.
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
## 2021-12-31 ENCOUNTER — Emergency Department: Payer: Medicaid Other

## 2021-12-31 LAB — HIV ANTIBODY (ROUTINE TESTING W REFLEX): HIV Screen 4th Generation wRfx: NONREACTIVE

## 2021-12-31 MED ORDER — KETOROLAC TROMETHAMINE 30 MG/ML IJ SOLN
INTRAMUSCULAR | Status: AC
  Filled 2021-12-31: qty 1

## 2021-12-31 MED ORDER — IOHEXOL 300 MG/ML  SOLN
100.0000 mL | Freq: Once | INTRAMUSCULAR | Status: AC | PRN
  Administered 2021-12-31: 100 mL via INTRAVENOUS

## 2021-12-31 MED ORDER — DIPHENHYDRAMINE HCL 50 MG/ML IJ SOLN
INTRAMUSCULAR | Status: AC
  Filled 2021-12-31: qty 1

## 2021-12-31 NOTE — ED Notes (Signed)
Son is requesting copies of imaging reports

## 2021-12-31 NOTE — Discharge Instructions (Addendum)
You may alternate Tylenol 1000 mg every 6 hours as needed for pain, fever and Ibuprofen 800 mg every 6-8 hours as needed for pain, fever.  Please take Ibuprofen with food.  Do not take more than 4000 mg of Tylenol (acetaminophen) in a 24 hour period.  You may use Benadryl over-the-counter as needed for eye redness, swelling and itching.

## 2022-01-17 ENCOUNTER — Ambulatory Visit: Payer: Self-pay | Admitting: Adult Health

## 2022-01-17 ENCOUNTER — Encounter: Payer: Self-pay | Admitting: Adult Health

## 2022-01-17 VITALS — BP 104/76 | HR 84 | Temp 98.4°F | Ht 60.63 in | Wt 175.6 lb

## 2022-01-17 DIAGNOSIS — R509 Fever, unspecified: Secondary | ICD-10-CM

## 2022-01-17 NOTE — Progress Notes (Signed)
   Acute Office Visit  Subjective:     Patient ID: Kirsten Mccann, female    DOB: 31-Jul-1975, 46 y.o.   MRN: [id number]  Chief Complaint  Patient presents with   fever of unknown origin    HPI Patient is in today for c/o fever, body aches for 1 week   ROS Fever x 1 week Heavy perspiration Body ache     Objective:    BP 104/76 (BP Location: Right Arm, Patient Position: Sitting, Cuff Size: Normal)   Pulse 84   Temp 98.4 F (36.9 C) (Oral)   Ht 5' 0.63" (1.54 m)   Wt 175 lb 9.6 oz (79.7 kg)   SpO2 98%   BMI 33.59 kg/m    Physical Exam  PHYSICAL EXAM: General:  alert, acutely ill appearing female. No distress Head:  normocephalic and atraumatic.   Eyes:  PERRLA, EOMI, vision grossly intact, conjuctive and sclerae within normal limits.   Mouth:  MMM, no erythema, no exudates, or lesions.   Neck:  supple, full ROM, trachea midline, no palp masses, no JVD, no carotid bruits.  No lymphadenopathy. Fatty deposit posterior neck; ? lipoma Lungs:  CTA; Respirations even, non labored Heart: RRR, no M/R/G, No bruits Abdomen:  soft, NT, ND, BS present and normoactive,  Neurologic:  CN II-XII intact,+5 strength globally, sensation grossly intact, gait normal.   Skin:  heavy perspiration Psychiatric: Cooperative, normal affect, A&O to person, place, time, situation   No results found for any visits on 01/17/22.      Assessment & Plan:   1. Fever of unknown origin Pt presents with fever for ~ 1 week. Denies, cough, congestion, sputum production, recent exposure to other respiratory infections. She c/o abdominal discomfort and feeling sick after she eats mammalian meat. Heavy perspiration. No known tick bites. Given presentation will treat with doxycycline 100 mg q12h for 14 days. Check Alpha galactose IgE panel. F/u 2 weeks  2. Evaluated for alpha gal allergy Blood test - Alpha galactose IgE panel   Problem List Items Addressed This Visit   None Visit Diagnoses      Fever of unknown origin    -  Primary       No orders of the defined types were placed in this encounter.   No follow-ups on file.  Na Waldrip Conni Elliot, NP

## 2022-01-29 ENCOUNTER — Other Ambulatory Visit: Payer: Self-pay | Admitting: Adult Health

## 2022-02-01 LAB — ALPHA-GAL PANEL
Allergen Lamb IgE: <0.1 kU/L
Beef IgE: <0.1 kU/L
IgE (Immunoglobulin E), Serum: 147 IU/mL (ref 6–495)
O215-IgE Alpha-Gal: <0.1 kU/L
Pork IgE: <0.1 kU/L

## 2022-02-28 ENCOUNTER — Encounter: Payer: Self-pay | Admitting: Internal Medicine

## 2022-02-28 ENCOUNTER — Ambulatory Visit: Payer: Self-pay | Admitting: Internal Medicine

## 2022-02-28 VITALS — BP 117/82 | HR 75 | Temp 98.3°F | Ht 62.5 in | Wt 178.0 lb

## 2022-02-28 DIAGNOSIS — E1165 Type 2 diabetes mellitus with hyperglycemia: Secondary | ICD-10-CM

## 2022-02-28 DIAGNOSIS — F4541 Pain disorder exclusively related to psychological factors: Secondary | ICD-10-CM

## 2022-02-28 DIAGNOSIS — G43709 Chronic migraine without aura, not intractable, without status migrainosus: Secondary | ICD-10-CM

## 2022-02-28 MED ORDER — SITAGLIPTIN PHOSPHATE 100 MG PO TABS: 100.0000 mg | ORAL_TABLET | Freq: Every day | ORAL | 1 refills | Status: DC

## 2022-02-28 MED ORDER — DULOXETINE HCL 20 MG PO CPEP: ORAL_CAPSULE | ORAL | 3 refills | Status: DC

## 2022-02-28 MED ORDER — TOPIRAMATE 25 MG PO TABS: ORAL_TABLET | ORAL | 1 refills | Status: DC

## 2022-02-28 MED ORDER — BUTALBITAL-APAP-CAFF-COD 50-325-40-30 MG PO CAPS: ORAL_CAPSULE | ORAL | 0 refills | Status: DC

## 2022-02-28 NOTE — Progress Notes (Signed)
[facility]   Internal MEDICINE  Office Visit Note  Patient Name: Kirsten Mccann  [id number]  [id number]  Date of Service: 02/28/2022  Chief Complaint  Patient presents with   Headache    Pt reports having a severe headache for a long time 6 years   Pain    Pt reports pain all over body    HPI Patient is here with her son, immigrant from [place] with multiple chronic problems Complaining of migraine headache, all the time patient has history of creatinine for last 6 years she has been having these headaches She is also complaining of pain all over her body She was also having of chronic abdominal pain with extreme severity,, went to ED ,CT of the abdomen pelvis which was normal. Patient is also having problem with metformin will like to change it to something else   Current Medication: Outpatient Encounter Medications as of 02/28/2022  Medication Sig   butalbital-apap-caffeine-codeine (FIORICET WITH CODEINE) 50-325-40-30 MG capsule TAKE ONE TAB FOR MIGRAINE HEADACHES ONLY AS NEEDED QD   DULoxetine (CYMBALTA) 20 MG capsule TAKE ONE TAB IN AM FOR PAIN OF FIBROMYALGIA   metFORMIN (GLUCOPHAGE) 500 MG tablet Take 500 tablets by mouth 2 (two) times daily with a meal.   sitaGLIPtin (JANUVIA) 100 MG tablet Take 1 tablet (100 mg total) by mouth daily. For diabetes in am   topiramate (TOPAMAX) 25 MG tablet Take one tab po qhs for headaches   [DISCONTINUED] acetaminophen (TYLENOL) 500 MG tablet Take one tab po qd prn for pain   [DISCONTINUED] ibuprofen (ADVIL) 400 MG tablet ONE TAB PO QD AS NEEDED FOR ACHES AND PAIN S   [DISCONTINUED] omeprazole (PRILOSEC) 20 MG capsule Take 1 capsule (20 mg total) by mouth daily. For stomach   [DISCONTINUED] sertraline (ZOLOFT) 50 MG tablet Take one tab po qd for depression at lunch   [DISCONTINUED] SSD 1 % cream Apply topically 2 (two) times daily.   No facility-administered encounter medications on file as of 02/28/2022.    Surgical  History: Past Surgical History:  Procedure Laterality Date   denies      Medical History: Past Medical History:  Diagnosis Date   Diabetes (HCC)    GERD (gastroesophageal reflux disease)    HLD     Family History: History reviewed. No pertinent family history.  Social History   Socioeconomic History   Marital status: Married    Spouse name: Not on file   Number of children: Not on file   Years of education: Not on file   Highest education level: Not on file  Occupational History   Not on file  Tobacco Use   Smoking status: Never   Smokeless tobacco: Never  Vaping Use   Vaping Use: Never used  Substance and Sexual Activity   Alcohol use: Never   Drug use: Never   Sexual activity: Yes    Birth control/protection: None    Comment: advised to use condoms/condoms given  Other Topics Concern   Not on file  Social History Narrative   Not on file   Social Determinants of Health   Financial Resource Strain: Not on file  Food Insecurity: Not on file  Transportation Needs: Not on file  Physical Activity: Not on file  Stress: Not on file  Social Connections: Not on file  Intimate Partner Violence: Not on file      Review of Systems  Constitutional:  Negative for fatigue and fever.  HENT:  Negative for congestion, mouth  sores and postnasal drip.   Eyes:  Positive for photophobia and visual disturbance.  Respiratory:  Negative for cough.   Cardiovascular:  Negative for chest pain.  Genitourinary:  Negative for flank pain.  Musculoskeletal:  Positive for arthralgias.  Neurological:  Positive for headaches.  Psychiatric/Behavioral: Negative.      Vital Signs: BP 117/82 (BP Location: Right Arm, Patient Position: Sitting, Cuff Size: Normal)   Pulse 75   Temp 98.3 F (36.8 C) (Oral)   Ht 5' 2.5" (1.588 m)   Wt 178 lb (80.7 kg)   SpO2 98%   BMI 32.04 kg/m    Physical Exam Constitutional:      Appearance: Normal appearance.  HENT:     Head: Normocephalic  and atraumatic.     Nose: Nose normal.     Mouth/Throat:     Mouth: Mucous membranes are moist.     Pharynx: No posterior oropharyngeal erythema.  Eyes:     Extraocular Movements: Extraocular movements intact.     Pupils: Pupils are equal, round, and reactive to light.  Cardiovascular:     Pulses: Normal pulses.     Heart sounds: Normal heart sounds.  Pulmonary:     Effort: Pulmonary effort is normal.     Breath sounds: Normal breath sounds.  Neurological:     General: No focal deficit present.     Mental Status: She is alert.  Psychiatric:        Mood and Affect: Mood normal.        Behavior: Behavior normal.        Assessment/Plan: 1. Psychosomatic pain I have evaluated this patient in the past as well strongly think that she has psychosomatic pain associated with depression and anxiety change of surroundings have given her Zoloft in the past but for what ever reason patient does not continue to take these medications, I have explained this to the son as well that she needs to continue on this medication until she is seen in the clinic again we will start with low-dose Cymbalta - DULoxetine (CYMBALTA) 20 MG capsule; TAKE ONE TAB IN AM FOR PAIN OF FIBROMYALGIA  Dispense: 90 capsule; Refill: 3  2. Poorly controlled diabetes mellitus ([place]) Again side effect with metformin compliance is poor we will start on Januvia 100 mg once a day for diabetes - sitaGLIPtin (JANUVIA) 100 MG tablet; Take 1 tablet (100 mg total) by mouth daily. For diabetes in am  Dispense: 90 tablet; Refill: 1  3. Chronic migraine without aura without status migrainosus, not intractable Chronic migraine headaches we will start on low-dose Topamax and for abortive therapy we will give her Fioricet with codeine might be a good Candidate for Imitrex as well however will reevaluate on next visit - butalbital-apap-caffeine-codeine (FIORICET WITH CODEINE) 50-325-40-30 MG capsule; TAKE ONE TAB FOR MIGRAINE HEADACHES  ONLY AS NEEDED QD  Dispense: 30 capsule; Refill: 0 - topiramate (TOPAMAX) 25 MG tablet; Take one tab po qhs for headaches  Dispense: 90 tablet; Refill: 1   General Counseling: Kirsten Mccann verbalizes understanding of the findings of todays visit and agrees with plan of treatment. I have discussed any further diagnostic evaluation that may be needed or ordered today. We also reviewed her medications today. she has been encouraged to call the office with any questions or concerns that should arise related to todays visit.    No orders of the defined types were placed in this encounter.   Meds ordered this encounter  Medications   DULoxetine (CYMBALTA)  20 MG capsule    Sig: TAKE ONE TAB IN AM FOR PAIN OF FIBROMYALGIA    Dispense:  90 capsule    Refill:  3   butalbital-apap-caffeine-codeine (FIORICET WITH CODEINE) 50-325-40-30 MG capsule    Sig: TAKE ONE TAB FOR MIGRAINE HEADACHES ONLY AS NEEDED QD    Dispense:  30 capsule    Refill:  0   sitaGLIPtin (JANUVIA) 100 MG tablet    Sig: Take 1 tablet (100 mg total) by mouth daily. For diabetes in am    Dispense:  90 tablet    Refill:  1   topiramate (TOPAMAX) 25 MG tablet    Sig: Take one tab po qhs for headaches    Dispense:  90 tablet    Refill:  1    Total time spent:30 Minutes Time spent includes review of chart, medications, test results, and follow up plan with the patient.   [place] Controlled Substance Database was reviewed by me.   Dr Lyndon Code Internal medicine

## 2022-05-02 ENCOUNTER — Ambulatory Visit: Payer: Self-pay | Admitting: Internal Medicine

## 2022-05-02 ENCOUNTER — Encounter: Payer: Self-pay | Admitting: Internal Medicine

## 2022-05-02 VITALS — BP 100/60 | HR 63 | Temp 98.0°F | Ht 62.0 in | Wt 168.8 lb

## 2022-05-02 DIAGNOSIS — E1165 Type 2 diabetes mellitus with hyperglycemia: Secondary | ICD-10-CM

## 2022-05-02 DIAGNOSIS — G43709 Chronic migraine without aura, not intractable, without status migrainosus: Secondary | ICD-10-CM

## 2022-05-02 DIAGNOSIS — F4541 Pain disorder exclusively related to psychological factors: Secondary | ICD-10-CM

## 2022-05-02 MED ORDER — FAMOTIDINE 20 MG PO TABS: ORAL_TABLET | ORAL | 3 refills | Status: DC

## 2022-05-02 NOTE — Progress Notes (Signed)
[facility]  Internal MEDICINE  Office Visit Note  Patient Name: Kirsten Mccann  [id number]  [id number]  Date of Service: 05/02/2022  Chief Complaint  Patient presents with   Fever    Pt reports continuous body ache and feverish feeling after she eats. Blood sugar 165   Abdominal Pain    HPI  Pt is here for routine follow up Patient is here with her son, language barrier is present patient is immigrant from [place] Patient has lost 10 pounds since last visit overall she feels well these medications have been working well for her, patient is on Topamax for migraine, Cymbalta continue to camp for psychosomatic abdominal pain and Januvia for diabetic control Her postprandial glucose today is 168 they do not have a glucometer at home Patient continues to have weakness questionable abdominal pain questionable gastritis after she eats, cannot pinpoint the symptoms Denies any chest pain shortness of breath, she does have feeling of fever however according to the son when he checks her temperature is normal   Current Medication: Outpatient Encounter Medications as of 05/02/2022  Medication Sig   famotidine (PEPCID) 20 MG tablet Take one tab po qd for stomach at lunch   butalbital-apap-caffeine-codeine (FIORICET WITH CODEINE) 50-325-40-30 MG capsule TAKE ONE TAB FOR MIGRAINE HEADACHES ONLY AS NEEDED QD   DULoxetine (CYMBALTA) 20 MG capsule TAKE ONE TAB IN AM FOR PAIN OF FIBROMYALGIA   sitaGLIPtin (JANUVIA) 100 MG tablet Take 1 tablet (100 mg total) by mouth daily. For diabetes in am   topiramate (TOPAMAX) 25 MG tablet Take one tab po qhs for headaches   [DISCONTINUED] metFORMIN (GLUCOPHAGE) 500 MG tablet Take 500 tablets by mouth 2 (two) times daily with a meal.   No facility-administered encounter medications on file as of 05/02/2022.    Surgical History: Past Surgical History:  Procedure Laterality Date   denies      Medical History: Past Medical History:  Diagnosis Date    Diabetes (HCC)    GERD (gastroesophageal reflux disease)    HLD     Family History: History reviewed. No pertinent family history.  Social History   Socioeconomic History   Marital status: Married    Spouse name: Not on file   Number of children: Not on file   Years of education: Not on file   Highest education level: Not on file  Occupational History   Not on file  Tobacco Use   Smoking status: Never   Smokeless tobacco: Never  Vaping Use   Vaping Use: Never used  Substance and Sexual Activity   Alcohol use: Never   Drug use: Never   Sexual activity: Yes    Birth control/protection: None    Comment: advised to use condoms/condoms given  Other Topics Concern   Not on file  Social History Narrative   Not on file   Social Determinants of Health   Financial Resource Strain: Not on file  Food Insecurity: Not on file  Transportation Needs: Not on file  Physical Activity: Not on file  Stress: Not on file  Social Connections: Not on file  Intimate Partner Violence: Not on file      Review of Systems  Constitutional:  Positive for fatigue. Negative for fever.  HENT:  Negative for congestion, mouth sores and postnasal drip.   Respiratory:  Negative for cough.   Cardiovascular:  Negative for chest pain.  Genitourinary:  Negative for flank pain.  Psychiatric/Behavioral: Negative.      Vital Signs: BP 100/60 (  BP Location: Left Arm, Patient Position: Sitting, Cuff Size: Normal)   Pulse 63   Temp 98 F (36.7 C) (Oral)   Ht 5\' 2"  (1.575 m)   Wt 168 lb 12.8 oz (76.6 kg)   SpO2 98%   BMI 30.87 kg/m    Physical Exam Constitutional:      Appearance: Normal appearance.  HENT:     Head: Normocephalic and atraumatic.     Nose: Nose normal.     Mouth/Throat:     Mouth: Mucous membranes are moist.     Pharynx: No posterior oropharyngeal erythema.  Eyes:     Extraocular Movements: Extraocular movements intact.     Pupils: Pupils are equal, round, and reactive to  light.  Cardiovascular:     Pulses: Normal pulses.     Heart sounds: Normal heart sounds.  Pulmonary:     Effort: Pulmonary effort is normal.     Breath sounds: Normal breath sounds.  Neurological:     General: No focal deficit present.     Mental Status: She is alert.  Psychiatric:        Mood and Affect: Mood normal.        Behavior: Behavior normal.       Assessment/Plan: 1. Psychosomatic pain Improved on Cymbalta  - famotidine (PEPCID) 20 MG tablet; Take one tab po qd for stomach at lunch  Dispense: 90 tablet; Refill: 3  2. Poorly controlled diabetes mellitus (HCC) Patient is to continue  3. Chronic migraine without aura without status migrainosus, not intractable Continue Topamax as before      General Counseling: [name] verbalizes understanding of the findings of todays visit and agrees with plan of treatment. I have discussed any further diagnostic evaluation that may be needed or ordered today. We also reviewed her medications today. she has been encouraged to call the office with any questions or concerns that should arise related to todays visit.    No orders of the defined types were placed in this encounter.   Meds ordered this encounter  Medications   famotidine (PEPCID) 20 MG tablet    Sig: Take one tab po qd for stomach at lunch    Dispense:  90 tablet    Refill:  3    Total time spent:45 Minutes Time spent includes review of chart, medications, test results, and follow up plan with the patient.   [place] Controlled Substance Database was reviewed by me.   Dr Internal medicine

## 2022-05-08 ENCOUNTER — Other Ambulatory Visit: Payer: Self-pay

## 2022-05-08 DIAGNOSIS — F4541 Pain disorder exclusively related to psychological factors: Secondary | ICD-10-CM

## 2022-06-30 ENCOUNTER — Encounter: Payer: Self-pay | Admitting: Physician Assistant

## 2022-06-30 ENCOUNTER — Ambulatory Visit (INDEPENDENT_AMBULATORY_CARE_PROVIDER_SITE_OTHER): Payer: Medicaid Other | Admitting: Physician Assistant

## 2022-06-30 VITALS — BP 137/79 | HR 64 | Ht 62.0 in | Wt 170.1 lb

## 2022-06-30 DIAGNOSIS — E785 Hyperlipidemia, unspecified: Secondary | ICD-10-CM | POA: Diagnosis not present

## 2022-06-30 DIAGNOSIS — Z7689 Persons encountering health services in other specified circumstances: Secondary | ICD-10-CM | POA: Diagnosis not present

## 2022-06-30 DIAGNOSIS — I10 Essential (primary) hypertension: Secondary | ICD-10-CM

## 2022-06-30 DIAGNOSIS — F4541 Pain disorder exclusively related to psychological factors: Secondary | ICD-10-CM | POA: Diagnosis not present

## 2022-06-30 DIAGNOSIS — F419 Anxiety disorder, unspecified: Secondary | ICD-10-CM

## 2022-06-30 DIAGNOSIS — G43809 Other migraine, not intractable, without status migrainosus: Secondary | ICD-10-CM

## 2022-06-30 DIAGNOSIS — E119 Type 2 diabetes mellitus without complications: Secondary | ICD-10-CM

## 2022-06-30 NOTE — Progress Notes (Signed)
I,Kirsten Mccann,acting as a Education administrator for [employer], PA-C.,have documented all relevant documentation on the behalf of Kirsten Speak, PA-C,as directed by  [employer], PA-C while in the presence of [employer], PA-C.   New patient visit   Patient: Kirsten Mccann   DOB: [birth year]   47 y.o. Female  MRN: [id number] Visit Date: 06/30/2022  Today's healthcare provider: Mardene Speak, PA-C  CC: DM uncontrolled and establish care  Subjective    Kirsten Mccann is a 47 y.o. female who presents today as a new patient to establish care.  HPI  Pt was seen at [facility] and did not have a " PCP" since she immigrated to [place]. She is here with a volunteer Kirsten Mccann and her contact number is [phone] Her son attends high school in [place], his name is Kirsten Mccann and his contact number is [phone]/p 3-4 pm  Reports having Pelvic Pain vs Abdominal pain complaint for a few months Pt is currently on Topamax for migraine, Cymbalta for psychosomatic abdominal pain and Januvia for DMII. Per chart review, pt endorsed having "weakness questionable abdominal pain, questionable gastritis after she eats, provider cannot pinpoint the symptoms" She has been taking famotidine for suspected GERD  Interpreter Kirsten Mccann #[id number] assisted with visit today. Patient accompanied by Kirsten Mccann (patient cvolunteer/driver) -Pap Smear: 3 months ago -Influenza Vaccine: would like to get -Covid Vaccine: would like to receive    Past Medical History:  Diagnosis Date   Diabetes ([place])    GERD (gastroesophageal reflux disease)    HLD    Past Surgical History:  Procedure Laterality Date   denies     No family status information on file.   No family history on file. Social History   Socioeconomic History   Marital status: Married    Spouse name: Not on file   Number of children: Not on file   Years of education: Not on file   Highest education level: Not on file  Occupational History   Not on file  Tobacco  Use   Smoking status: Never   Smokeless tobacco: Never  Vaping Use   Vaping Use: Never used  Substance and Sexual Activity   Alcohol use: Never   Drug use: Never   Sexual activity: Yes    Birth control/protection: None    Comment: advised to use condoms/condoms given  Other Topics Concern   Not on file  Social History Narrative   Not on file   Social Determinants of Health   Financial Resource Strain: Not on file  Food Insecurity: Not on file  Transportation Needs: Not on file  Physical Activity: Not on file  Stress: Not on file  Social Connections: Not on file   Outpatient Medications Prior to Visit  Medication Sig   butalbital-apap-caffeine-codeine (FIORICET WITH CODEINE) 50-325-40-30 MG capsule TAKE ONE TAB FOR MIGRAINE HEADACHES ONLY AS NEEDED QD   DULoxetine (CYMBALTA) 20 MG capsule TAKE ONE TAB IN AM FOR PAIN OF FIBROMYALGIA   famotidine (PEPCID) 20 MG tablet Take one tab po qd for stomach at lunch   sitaGLIPtin (JANUVIA) 100 MG tablet Take 1 tablet (100 mg total) by mouth daily. For diabetes in am   topiramate (TOPAMAX) 25 MG tablet Take one tab po qhs for headaches   No facility-administered medications prior to visit.   No Known Allergies  Immunization History  Administered Date(s) Administered   Influenza,inj,Quad PF,6+ Mos 03/13/2021   Tdap 03/13/2021    Health Maintenance  Topic Date Due  Diabetic kidney evaluation - Urine ACR  Never done   PAP SMEAR-Modifier  Never done   INFLUENZA VACCINE  01/13/2022   COVID-19 Vaccine (3 - 2023-24 season) 02/13/2022   Diabetic kidney evaluation - eGFR measurement  12/31/2022   DTaP/Tdap/Td (2 - Td or Tdap) 03/14/2031   Hepatitis C Screening  Completed   HIV Screening  Completed   HPV VACCINES  Aged Out    Patient Care Team: System, Provider Not In as PCP - General  Review of Systems  All other systems reviewed and are negative. Except see HPI     Objective    BP 137/79 (BP Location: Right Arm, Patient  Position: Sitting, Cuff Size: Large)   Pulse 64   Ht 5\' 2"  (1.575 m)   Wt 170 lb 1.6 oz (77.2 kg)   SpO2 100%   BMI 31.11 kg/m    Physical Exam Vitals reviewed.  Constitutional:      General: She is not in acute distress.    Appearance: Normal appearance. She is well-developed. She is not diaphoretic.  HENT:     Head: Normocephalic and atraumatic.  Eyes:     General: No scleral icterus.    Conjunctiva/sclera: Conjunctivae normal.  Neck:     Thyroid: No thyromegaly.  Cardiovascular:     Rate and Rhythm: Normal rate and regular rhythm.     Pulses: Normal pulses.     Heart sounds: Normal heart sounds. No murmur heard. Pulmonary:     Effort: Pulmonary effort is normal. No respiratory distress.     Breath sounds: Normal breath sounds. No wheezing, rhonchi or rales.  Musculoskeletal:     Cervical back: Neck supple.     Right lower leg: No edema.     Left lower leg: No edema.  Lymphadenopathy:     Cervical: No cervical adenopathy.  Skin:    General: Skin is warm and dry.     Findings: No rash.  Neurological:     Mental Status: She is alert and oriented to person, place, and time. Mental status is at baseline.  Psychiatric:        Mood and Affect: Mood normal.        Behavior: Behavior normal.     Depression Screen     No data to display         No results found for any visits on 06/30/22.  Assessment & Plan      Type 2 diabetes mellitus without complication, unspecified whether long term insulin use (HCC) Chronic Continue taking sitagliptin 100mg  daily Encouraged low carb diet and daily exercise - Lipid panel - Hemoglobin A1c - TSH - Comprehensive metabolic panel - CBC with Differential/Platelet - Urine Microalbumin w/creat. ratio Will FU after lab results  Psychosomatic pain Anxiety and depression? Chronic  Could be situational Her husband and children were left in [place] We could not proceed with PHQ9 and GAD7 due to language barrier? Contnnue  taking Cymbalta 20mg  daily, continue famotidine - Lipid panel - Hemoglobin A1c - TSH - Comprehensive metabolic panel - CBC with Differential/Platelet - Urine Microalbumin w/creat. ratio Will need to FU at her next appt  Hyperlipidemia, unspecified hyperlipidemia type Chronic Not on medication - Lipid panel - Hemoglobin A1c - TSH - Comprehensive metabolic panel - CBC with Differential/Platelet - Urine Microalbumin w/creat. ratio Will need to reassess  Migraine Per chart review from 11/18/223 Chronic Continue Topamax and fiorcet  Obesity BMI 31.11 Per chart review, pt lost 20 pounds Needs blood work,  see above Will FU  Establishing care with new doctor, encounter for We discussed with pt and agreed to start with most concerning matters including diabetes and establishing baseline. During the visit, translator line stopped and has to be reconnected twice.to three times The patient was advised to call back or seek an in-person evaluation if the symptoms worsen or if the condition fails to improve as anticipated.  I discussed the assessment and treatment plan with the patient. The patient was provided an opportunity to ask questions and all were answered. The patient agreed with the plan and demonstrated an understanding of the instructions.  The entirety of the information documented in the History of Present Illness, Review of Systems and Physical Exam were personally obtained by me. Portions of this information were initially documented by the CMA and reviewed by me for thoroughness and accuracy.   I spent 45 minutes dedicated to the care of this patient on the date of this encounter to include pre-visit review of records, face-to-face with the patient discussing condition/treatment , and post visit ordering of testing and documentation in the setting language barrier and technical difficulties  Kirsten Mccann, [facility], [place] [phone]  (phone) [phone] (fax)

## 2022-07-01 LAB — COMPREHENSIVE METABOLIC PANEL
ALT: 12 IU/L (ref 0–32)
AST: 13 IU/L (ref 0–40)
Albumin/Globulin Ratio: 1.6 (ref 1.2–2.2)
Albumin: 4.5 g/dL (ref 3.9–4.9)
Alkaline Phosphatase: 79 IU/L (ref 44–121)
BUN/Creatinine Ratio: 18 (ref 9–23)
BUN: 11 mg/dL (ref 6–24)
Bilirubin Total: <0.2 mg/dL (ref 0.0–1.2)
CO2: 21 mmol/L (ref 20–29)
Calcium: 9.4 mg/dL (ref 8.7–10.2)
Chloride: 103 mmol/L (ref 96–106)
Creatinine, Ser: 0.61 mg/dL (ref 0.57–1.00)
Globulin, Total: 2.9 g/dL (ref 1.5–4.5)
Glucose: 122 mg/dL — ABNORMAL HIGH (ref 70–99)
Potassium: 4.1 mmol/L (ref 3.5–5.2)
Sodium: 138 mmol/L (ref 134–144)
Total Protein: 7.4 g/dL (ref 6.0–8.5)
eGFR: 113 mL/min/{1.73_m2} (ref >59)

## 2022-07-01 LAB — CBC WITH DIFFERENTIAL/PLATELET
Basophils Absolute: 0 10*3/uL (ref 0.0–0.2)
Basos: 1 %
EOS (ABSOLUTE): 0.1 10*3/uL (ref 0.0–0.4)
Eos: 1 %
Hematocrit: 38.8 % (ref 34.0–46.6)
Hemoglobin: 12.7 g/dL (ref 11.1–15.9)
Immature Grans (Abs): 0 10*3/uL (ref 0.0–0.1)
Immature Granulocytes: 0 %
Lymphocytes Absolute: 2.2 10*3/uL (ref 0.7–3.1)
Lymphs: 33 %
MCH: 27.6 pg (ref 26.6–33.0)
MCHC: 32.7 g/dL (ref 31.5–35.7)
MCV: 84 fL (ref 79–97)
Monocytes Absolute: 0.4 10*3/uL (ref 0.1–0.9)
Monocytes: 7 %
Neutrophils Absolute: 3.8 10*3/uL (ref 1.4–7.0)
Neutrophils: 58 %
Platelets: 250 10*3/uL (ref 150–450)
RBC: 4.6 x10E6/uL (ref 3.77–5.28)
RDW: 12.2 % (ref 11.7–15.4)
WBC: 6.5 10*3/uL (ref 3.4–10.8)

## 2022-07-01 LAB — LIPID PANEL
Chol/HDL Ratio: 6.3 ratio — ABNORMAL HIGH (ref 0.0–4.4)
Cholesterol, Total: 215 mg/dL — ABNORMAL HIGH (ref 100–199)
HDL: 34 mg/dL — ABNORMAL LOW (ref >39)
LDL Chol Calc (NIH): 116 mg/dL — ABNORMAL HIGH (ref 0–99)
Triglycerides: 374 mg/dL — ABNORMAL HIGH (ref 0–149)
VLDL Cholesterol Cal: 65 mg/dL — ABNORMAL HIGH (ref 5–40)

## 2022-07-01 LAB — HEMOGLOBIN A1C
Est. average glucose Bld gHb Est-mCnc: 140 mg/dL
Hgb A1c MFr Bld: 6.5 % — ABNORMAL HIGH (ref 4.8–5.6)

## 2022-07-01 LAB — TSH: TSH: 0.601 u[IU]/mL (ref 0.450–4.500)

## 2022-07-01 LAB — MICROALBUMIN / CREATININE URINE RATIO
Creatinine, Urine: 35.3 mg/dL
Microalb/Creat Ratio: <8 mg/g creat (ref 0–29)
Microalbumin, Urine: <3 ug/mL

## 2022-07-02 DIAGNOSIS — G43909 Migraine, unspecified, not intractable, without status migrainosus: Secondary | ICD-10-CM | POA: Insufficient documentation

## 2022-07-02 DIAGNOSIS — Z7689 Persons encountering health services in other specified circumstances: Secondary | ICD-10-CM | POA: Insufficient documentation

## 2022-07-02 DIAGNOSIS — F32A Depression, unspecified: Secondary | ICD-10-CM | POA: Insufficient documentation

## 2022-07-02 DIAGNOSIS — F4541 Pain disorder exclusively related to psychological factors: Secondary | ICD-10-CM | POA: Insufficient documentation

## 2022-07-02 DIAGNOSIS — E119 Type 2 diabetes mellitus without complications: Secondary | ICD-10-CM | POA: Insufficient documentation

## 2022-07-02 DIAGNOSIS — E785 Hyperlipidemia, unspecified: Secondary | ICD-10-CM | POA: Insufficient documentation

## 2022-07-02 DIAGNOSIS — E1169 Type 2 diabetes mellitus with other specified complication: Secondary | ICD-10-CM | POA: Insufficient documentation

## 2022-07-14 ENCOUNTER — Encounter: Payer: Self-pay | Admitting: *Deleted

## 2022-07-18 ENCOUNTER — Ambulatory Visit: Payer: Self-pay | Admitting: Internal Medicine

## 2022-07-24 LAB — TSH: TSH: 3.17 (ref 0.41–5.90)

## 2022-07-31 ENCOUNTER — Encounter: Payer: Self-pay | Admitting: Family Medicine

## 2022-07-31 ENCOUNTER — Ambulatory Visit (INDEPENDENT_AMBULATORY_CARE_PROVIDER_SITE_OTHER): Payer: Medicaid Other | Admitting: Family Medicine

## 2022-07-31 ENCOUNTER — Other Ambulatory Visit (HOSPITAL_COMMUNITY)
Admission: RE | Admit: 2022-07-31 | Discharge: 2022-07-31 | Disposition: A | Discharge status: Home / Self Care | Payer: Medicaid Other | Source: Ambulatory Visit | Attending: Family Medicine | Admitting: Family Medicine

## 2022-07-31 VITALS — BP 126/70 | HR 66 | Temp 98.4°F | Wt 175.9 lb

## 2022-07-31 DIAGNOSIS — E785 Hyperlipidemia, unspecified: Secondary | ICD-10-CM

## 2022-07-31 DIAGNOSIS — N898 Other specified noninflammatory disorders of vagina: Secondary | ICD-10-CM | POA: Diagnosis not present

## 2022-07-31 DIAGNOSIS — N921 Excessive and frequent menstruation with irregular cycle: Secondary | ICD-10-CM | POA: Diagnosis present

## 2022-07-31 DIAGNOSIS — G8929 Other chronic pain: Secondary | ICD-10-CM | POA: Diagnosis not present

## 2022-07-31 DIAGNOSIS — E1169 Type 2 diabetes mellitus with other specified complication: Secondary | ICD-10-CM

## 2022-07-31 DIAGNOSIS — E0859 Diabetes mellitus due to underlying condition with other circulatory complications: Secondary | ICD-10-CM

## 2022-07-31 DIAGNOSIS — Z124 Encounter for screening for malignant neoplasm of cervix: Secondary | ICD-10-CM

## 2022-07-31 DIAGNOSIS — R102 Pelvic and perineal pain: Secondary | ICD-10-CM | POA: Insufficient documentation

## 2022-07-31 DIAGNOSIS — G43701 Chronic migraine without aura, not intractable, with status migrainosus: Secondary | ICD-10-CM

## 2022-07-31 DIAGNOSIS — R52 Pain, unspecified: Secondary | ICD-10-CM

## 2022-07-31 DIAGNOSIS — E119 Type 2 diabetes mellitus without complications: Secondary | ICD-10-CM | POA: Insufficient documentation

## 2022-07-31 MED ORDER — DULOXETINE HCL 20 MG PO CPEP: 40.0000 mg | ORAL_CAPSULE | Freq: Every day | ORAL | 0 refills | Status: DC

## 2022-07-31 MED ORDER — ROSUVASTATIN CALCIUM 5 MG PO TABS: 5.0000 mg | ORAL_TABLET | Freq: Every day | ORAL | 0 refills | Status: DC

## 2022-07-31 MED ORDER — SITAGLIPTIN PHOSPHATE 100 MG PO TABS: 100.0000 mg | ORAL_TABLET | Freq: Every day | ORAL | 0 refills | Status: DC

## 2022-07-31 NOTE — Assessment & Plan Note (Signed)
Acute on chronic, notes excessive bleeding and discharge Referral placed to GYN Cytology completed

## 2022-07-31 NOTE — Assessment & Plan Note (Signed)
Chronic, uncontrolled Additional Rx- Crestor 5 mg added LDL goal of <70

## 2022-07-31 NOTE — Assessment & Plan Note (Signed)
Acute on chronic, notes excessive bleeding and discharge Referral placed to GYN Cytology completed  PAP completed 17 days of bleeding followed by no bleeding Pt defers repeat hormones at this time

## 2022-07-31 NOTE — Progress Notes (Signed)
I,Connie R Striblin,acting as a Education administrator for Gwyneth Sprout, FNP.,have documented all relevant documentation on the behalf of Gwyneth Sprout, FNP,as directed by  Gwyneth Sprout, FNP while in the presence of Gwyneth Sprout, FNP.   Established patient visit   Patient: Kirsten Mccann   DOB: 1976-05-12   47 y.o. Female  MRN: [id number] Visit Date: 07/31/2022  Today's healthcare provider: Gwyneth Sprout, FNP  Introduced to nurse practitioner role and practice setting.  All questions answered.  Discussed provider/patient relationship and expectations.   Chief Complaint  Patient presents with   Follow-up   Unable to connect to Pashto interpreter during time of appt; family member served as Producer, television/film/video and interpreter for examination.   Subjective    HPI  Anxiety, Follow-up  She was last seen for anxiety 1 months ago. Changes made at last visit include Contnnue taking Cymbalta 55m daily, continue famotidine .   She reports excellent compliance with treatment. She reports good tolerance of treatment. She is not having side effects.   She feels her anxiety is moderate and Improved since last visit.  Symptoms: No chest pain No difficulty concentrating  No dizziness Yes fatigue  Yes feelings of losing control Yes insomnia  No irritable No palpitations  No panic attacks Yes racing thoughts  No shortness of breath No sweating  No tremors/shakes    GAD-7 Results     No data to display          PHQ-9 Scores     No data to display          ---------------------------------------------------------------------------------------------------  Patient complains of body swelling and knee pain, no other new concerns at this time.   Medications: Outpatient Medications Prior to Visit  Medication Sig   butalbital-apap-caffeine-codeine (FIORICET WITH CODEINE) 50-325-40-30 MG capsule TAKE ONE TAB FOR MIGRAINE HEADACHES ONLY AS NEEDED QD   famotidine (PEPCID) 20 MG tablet Take one tab  po qd for stomach at lunch   topiramate (TOPAMAX) 25 MG tablet Take one tab po qhs for headaches   [DISCONTINUED] DULoxetine (CYMBALTA) 20 MG capsule TAKE ONE TAB IN AM FOR PAIN OF FIBROMYALGIA   [DISCONTINUED] sitaGLIPtin (JANUVIA) 100 MG tablet Take 1 tablet (100 mg total) by mouth daily. For diabetes in am   No facility-administered medications prior to visit.    Review of Systems      Objective    BP 126/70 (BP Location: Right Arm, Patient Position: Sitting, Cuff Size: Normal)   Pulse 66   Temp 98.4 F (36.9 C) (Oral)   Wt 175 lb 14.4 oz (79.8 kg)   SpO2 100%   BMI 32.17 kg/m     Physical Exam Vitals and nursing note reviewed. Exam conducted with a chaperone present.  Constitutional:      General: She is not in acute distress.    Appearance: Normal appearance. She is obese. She is not ill-appearing, toxic-appearing or diaphoretic.  HENT:     Head: Normocephalic and atraumatic.  Cardiovascular:     Rate and Rhythm: Normal rate and regular rhythm.     Pulses: Normal pulses.     Heart sounds: Normal heart sounds. No murmur heard.    No friction rub. No gallop.  Pulmonary:     Effort: Pulmonary effort is normal. No respiratory distress.     Breath sounds: Normal breath sounds. No stridor. No wheezing, rhonchi or rales.  Chest:     Chest wall: No tenderness.  Abdominal:  General: Bowel sounds are normal.     Palpations: Abdomen is soft.     Tenderness: There is abdominal tenderness.  Genitourinary:    General: Normal vulva.     Exam position: Lithotomy position.     Tanner stage (genital): 5.     Vagina: Vaginal discharge and tenderness present.     Cervix: Normal.     Uterus: Normal.      Adnexa:        Right: Tenderness present.        Left: Tenderness present.   Musculoskeletal:        General: No swelling, tenderness, deformity or signs of injury. Normal range of motion.     Right lower leg: No edema.     Left lower leg: No edema.  Skin:    General:  Skin is warm and dry.     Capillary Refill: Capillary refill takes less than 2 seconds.     Coloration: Skin is not jaundiced or pale.     Findings: No bruising, erythema, lesion or rash.  Neurological:     General: No focal deficit present.     Mental Status: She is alert and oriented to person, place, and time. Mental status is at baseline.     Cranial Nerves: No cranial nerve deficit.     Sensory: No sensory deficit.     Motor: No weakness.     Coordination: Coordination normal.  Psychiatric:        Mood and Affect: Mood normal.        Behavior: Behavior normal.        Thought Content: Thought content normal.        Judgment: Judgment normal.     No results found for any visits on 07/31/22.  Assessment & Plan     Problem List Items Addressed This Visit       Cardiovascular and Mediastinum   Migraine    Chronic worsening  Worse at night Not improved with topamax 25 and fioricet  Referral placed       Relevant Medications   DULoxetine (CYMBALTA) 20 MG capsule   rosuvastatin (CRESTOR) 5 MG tablet   Other Relevant Orders   Ambulatory referral to Neurology     Endocrine   Diabetes mellitus ([place])    Chronic, with circ complaints of bone pain and headaches Referral placed to neuro A1c stable Continue januvia at 100 mg       Relevant Medications   sitaGLIPtin (JANUVIA) 100 MG tablet   rosuvastatin (CRESTOR) 5 MG tablet   Hyperlipidemia associated with type 2 diabetes mellitus (HCC) - Primary    Chronic, uncontrolled Additional Rx- Crestor 5 mg added LDL goal of <70      Relevant Medications   sitaGLIPtin (JANUVIA) 100 MG tablet   rosuvastatin (CRESTOR) 5 MG tablet     Other   Adnexal tenderness    Acute on chronic, notes excessive bleeding and discharge Referral placed to GYN Cytology completed       Generalized pain    Chronic, not improved Will increase cymbalta to 40 mg with 6 week f/u      Relevant Medications   DULoxetine (CYMBALTA) 20 MG  capsule   Other Relevant Orders   Ambulatory referral to Neurology   Menorrhagia with irregular cycle    Acute on chronic, notes excessive bleeding and discharge Referral placed to GYN Cytology completed  PAP completed 17 days of bleeding followed by no bleeding Pt defers repeat hormones at  this time       Relevant Orders   Cytology - PAP   Ambulatory referral to Gynecology   Cervicovaginal ancillary only   Pap smear for cervical cancer screening   Relevant Orders   Cytology - PAP   Cervicovaginal ancillary only   Vaginal discharge   Relevant Orders   Cervicovaginal ancillary only   Return in about 6 weeks (around 09/11/2022) for anxiety and depression.     Vonna Kotyk, FNP, have reviewed all documentation for this visit. The documentation on 07/31/22 for the exam, diagnosis, procedures, and orders are all accurate and complete.  Gwyneth Sprout, [place] [phone] (phone) [phone] (fax)  [place]

## 2022-07-31 NOTE — Assessment & Plan Note (Signed)
Chronic, not improved Will increase cymbalta to 40 mg with 6 week f/u

## 2022-07-31 NOTE — Assessment & Plan Note (Signed)
Chronic worsening  Worse at night Not improved with topamax 25 and fioricet  Referral placed

## 2022-07-31 NOTE — Assessment & Plan Note (Signed)
Chronic, with circ complaints of bone pain and headaches Referral placed to neuro A1c stable Continue januvia at 100 mg

## 2022-08-04 ENCOUNTER — Other Ambulatory Visit: Payer: Self-pay | Admitting: Family Medicine

## 2022-08-04 LAB — CERVICOVAGINAL ANCILLARY ONLY
Bacterial Vaginitis (gardnerella): POSITIVE — AB
Candida Glabrata: NEGATIVE
Candida Vaginitis: NEGATIVE
Chlamydia: NEGATIVE
Comment: NEGATIVE
Comment: NEGATIVE
Comment: NEGATIVE
Comment: NEGATIVE
Comment: NEGATIVE
Comment: NORMAL
Neisseria Gonorrhea: NEGATIVE
Trichomonas: NEGATIVE

## 2022-08-04 MED ORDER — METRONIDAZOLE 500 MG PO TABS: 500.0000 mg | ORAL_TABLET | Freq: Two times a day (BID) | ORAL | 0 refills | Status: AC

## 2022-08-04 NOTE — Progress Notes (Signed)
Flagyl called in.  Kirsten Mccann, [address] [place], [place] [address] [phone] (phone) [phone] (fax) [place]

## 2022-08-06 LAB — CYTOLOGY - PAP
Chlamydia: NEGATIVE
Comment: NEGATIVE
Comment: NEGATIVE
Comment: NEGATIVE
Comment: NEGATIVE
Comment: NORMAL
Diagnosis: NEGATIVE
HSV1: NEGATIVE
HSV2: NEGATIVE
High risk HPV: NEGATIVE
Neisseria Gonorrhea: NEGATIVE
Trichomonas: NEGATIVE

## 2022-08-06 NOTE — Progress Notes (Signed)
Normal PAP; continue antibiotic for BV.

## 2022-08-10 ENCOUNTER — Telehealth: Payer: Self-pay

## 2022-08-10 NOTE — Telephone Encounter (Signed)
Copied from [place] [id number]. Topic: General - Other >> Aug 07, 2022  5:03 PM Burman Freestone wrote: Reason for CRM: I called pt concerning referrals. Her son Mike Craze got on phone because pt does not speak English She is requesting her lab results Amir's phone number [phone]

## 2022-08-12 NOTE — Telephone Encounter (Signed)
Call could not be completed. CRM created. Ok for [facility] to advise of patients son or patient returns call.

## 2022-08-13 ENCOUNTER — Encounter: Payer: Self-pay | Admitting: Obstetrics and Gynecology

## 2022-08-15 ENCOUNTER — Ambulatory Visit: Payer: Self-pay | Admitting: Internal Medicine

## 2022-09-07 NOTE — Progress Notes (Unsigned)
    Kirsten Speak, PA-C   No chief complaint on file.   HPI:      Ms. Kirsten Mccann is a 47 y.o. No obstetric history on file. whose LMP was No LMP recorded., presents today for NP eval of irregular menses, referred by PCP Normal TSH 1/24 Neg pap/neg HPV DNA 2/24  PT NEEDS INTERPRETER  Patient Active Problem List   Diagnosis Date Noted   Pap smear for cervical cancer screening 07/31/2022   Adnexal tenderness 07/31/2022   Vaginal discharge 07/31/2022   Menorrhagia with irregular cycle 07/31/2022   Generalized pain 07/31/2022   Diabetes mellitus ([place]) 07/31/2022   Type 2 diabetes mellitus without complication ([place]) 0000000   Anxiety and depression 07/02/2022   Psychosomatic pain 07/02/2022   Migraine 07/02/2022   Hyperlipidemia associated with type 2 diabetes mellitus ([place]) 07/02/2022   Establishing care with new doctor, encounter for 07/02/2022   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  Social History   Socioeconomic History   Marital status: Married    Spouse name: Not on file   Number of children: Not on file   Years of education: Not on file   Highest education level: Not on file  Occupational History   Not on file  Tobacco Use   Smoking status: Never   Smokeless tobacco: Never  Vaping Use   Vaping Use: Never used  Substance and Sexual Activity   Alcohol use: Never   Drug use: Never   Sexual activity: Yes    Birth control/protection: None    Comment: advised to use condoms/condoms given  Other Topics Concern   Not on file  Social History Narrative   Not on file   Social Determinants of Health   Financial Resource Strain: Not on file  Food Insecurity: Not on file  Transportation Needs: Not on file  Physical Activity: Not on file  Stress: Not on file  Social Connections: Not on file  Intimate Partner Violence: Not on file    Outpatient  Medications Prior to Visit  Medication Sig Dispense Refill   butalbital-apap-caffeine-codeine (FIORICET WITH CODEINE) 50-325-40-30 MG capsule TAKE ONE TAB FOR MIGRAINE HEADACHES ONLY AS NEEDED QD 30 capsule 0   DULoxetine (CYMBALTA) 20 MG capsule Take 2 capsules (40 mg total) by mouth daily. 180 capsule 0   famotidine (PEPCID) 20 MG tablet Take one tab po qd for stomach at lunch 90 tablet 3   rosuvastatin (CRESTOR) 5 MG tablet Take 1 tablet (5 mg total) by mouth daily. 90 tablet 0   sitaGLIPtin (JANUVIA) 100 MG tablet Take 1 tablet (100 mg total) by mouth daily. 90 tablet 0   topiramate (TOPAMAX) 25 MG tablet Take one tab po qhs for headaches 90 tablet 1   No facility-administered medications prior to visit.      ROS:  Review of Systems BREAST: No symptoms   OBJECTIVE:   Vitals:  There were no vitals taken for this visit.  Physical Exam  Results: No results found for this or any previous visit (from the past 24 hour(s)).   Assessment/Plan: No diagnosis found.    No orders of the defined types were placed in this encounter.     No follow-ups on file.  Mckala Pantaleon B. Arlow Spiers, PA-C 09/07/2022 7:55 PM

## 2022-09-08 ENCOUNTER — Encounter: Payer: Self-pay | Admitting: Obstetrics and Gynecology

## 2022-09-08 ENCOUNTER — Ambulatory Visit (INDEPENDENT_AMBULATORY_CARE_PROVIDER_SITE_OTHER): Payer: Medicaid Other | Admitting: Obstetrics and Gynecology

## 2022-09-08 VITALS — BP 128/90 | Wt 175.0 lb

## 2022-09-08 DIAGNOSIS — N921 Excessive and frequent menstruation with irregular cycle: Secondary | ICD-10-CM

## 2022-09-08 MED ORDER — NORETHINDRONE ACETATE 5 MG PO TABS: 5.0000 mg | ORAL_TABLET | Freq: Every day | ORAL | 0 refills | Status: DC

## 2022-09-08 NOTE — Patient Instructions (Signed)
I value your feedback and you entrusting us with your care. If you get a [facility] patient survey, I would appreciate you taking the time to let us know about your experience today. Thank you! ? ? ?

## 2022-09-15 ENCOUNTER — Ambulatory Visit
Admission: RE | Admit: 2022-09-15 | Discharge: 2022-09-15 | Disposition: A | Discharge status: Home / Self Care | Payer: Medicaid Other | Attending: Physician Assistant | Admitting: Physician Assistant

## 2022-09-15 ENCOUNTER — Encounter: Payer: Self-pay | Admitting: Physician Assistant

## 2022-09-15 ENCOUNTER — Ambulatory Visit
Admission: RE | Admit: 2022-09-15 | Discharge: 2022-09-15 | Disposition: A | Discharge status: Home / Self Care | Payer: Medicaid Other | Source: Ambulatory Visit | Attending: Physician Assistant | Admitting: Physician Assistant

## 2022-09-15 ENCOUNTER — Other Ambulatory Visit (HOSPITAL_COMMUNITY)
Admission: RE | Admit: 2022-09-15 | Discharge: 2022-09-15 | Disposition: A | Discharge status: Home / Self Care | Payer: Medicaid Other | Source: Ambulatory Visit | Attending: Physician Assistant | Admitting: Physician Assistant

## 2022-09-15 ENCOUNTER — Ambulatory Visit (INDEPENDENT_AMBULATORY_CARE_PROVIDER_SITE_OTHER): Payer: Medicaid Other | Admitting: Physician Assistant

## 2022-09-15 VITALS — BP 143/83 | HR 64 | Wt 174.9 lb

## 2022-09-15 DIAGNOSIS — N760 Acute vaginitis: Secondary | ICD-10-CM | POA: Insufficient documentation

## 2022-09-15 DIAGNOSIS — G8929 Other chronic pain: Secondary | ICD-10-CM

## 2022-09-15 DIAGNOSIS — E1169 Type 2 diabetes mellitus with other specified complication: Secondary | ICD-10-CM | POA: Diagnosis not present

## 2022-09-15 DIAGNOSIS — F419 Anxiety disorder, unspecified: Secondary | ICD-10-CM

## 2022-09-15 DIAGNOSIS — E0859 Diabetes mellitus due to underlying condition with other circulatory complications: Secondary | ICD-10-CM

## 2022-09-15 DIAGNOSIS — B9689 Other specified bacterial agents as the cause of diseases classified elsewhere: Secondary | ICD-10-CM | POA: Diagnosis present

## 2022-09-15 DIAGNOSIS — M25561 Pain in right knee: Secondary | ICD-10-CM | POA: Insufficient documentation

## 2022-09-15 DIAGNOSIS — E785 Hyperlipidemia, unspecified: Secondary | ICD-10-CM

## 2022-09-15 DIAGNOSIS — F32A Depression, unspecified: Secondary | ICD-10-CM

## 2022-09-15 NOTE — Progress Notes (Unsigned)
     Argentina Ponder [place],acting as a Education administrator for [employer], PA-C.,have documented all relevant documentation on the behalf of Mardene Speak, PA-C,as directed by  [employer], PA-C while in the presence of [employer], PA-C.   Established patient visit   Patient: Kirsten Mccann   DOB: Mar 15, 1976   47 y.o. Female  MRN: [id number] Visit Date: 09/15/2022  Today's healthcare provider: Mardene Speak, PA-C   No chief complaint on file.  Subjective    HPI  Today's visit was completed using the assistance of interpreter [place]. ID number [id number] Anxiety, Follow-up  She was last seen for anxiety 2 months ago. Changes made at last visit include continue cymbalta.   She reports excellent compliance with treatment. She reports excellent tolerance of treatment. She is not having side effects.   She feels her anxiety is severe and Unchanged since last visit.  Symptoms: Yes chest pain Yes difficulty concentrating  No dizziness No fatigue  No feelings of losing control No insomnia  Yes irritable Yes palpitations  Yes panic attacks No racing thoughts  No shortness of breath Yes sweating  Yes tremors/shakes    GAD-7 Results    09/15/2022   10:43 AM  GAD-7 Generalized Anxiety Disorder Screening Tool  1. Feeling Nervous, Anxious, or on Edge 3  2. Not Being Able to Stop or Control Worrying 3  3. Worrying Too Much About Different Things 3  4. Trouble Relaxing 0  5. Being So Restless it's Hard To Sit Still 3  6. Becoming Easily Annoyed or Irritable 3  7. Feeling Afraid As If Something Awful Might Happen 3  Total GAD-7 Score 18  Difficulty At Work, Home, or Getting  Along With Others? Somewhat difficult    PHQ-9 Scores    09/15/2022   10:30 AM  PHQ9 SCORE ONLY  PHQ-9 Total Score 11    ---------------------------------------------------------------------------------------------------  ---------------------------------------------------------------------------------------------------   Medications: Outpatient Medications Prior to Visit  Medication Sig   butalbital-apap-caffeine-codeine (FIORICET WITH CODEINE) 50-325-40-30 MG capsule TAKE ONE TAB FOR MIGRAINE HEADACHES ONLY AS NEEDED QD   DULoxetine (CYMBALTA) 20 MG capsule Take 2 capsules (40 mg total) by mouth daily.   famotidine (PEPCID) 20 MG tablet Take one tab po qd for stomach at lunch   norethindrone (AYGESTIN) 5 MG tablet Take 1 tablet (5 mg total) by mouth daily.   rosuvastatin (CRESTOR) 5 MG tablet Take 1 tablet (5 mg total) by mouth daily.   sitaGLIPtin (JANUVIA) 100 MG tablet Take 1 tablet (100 mg total) by mouth daily.   topiramate (TOPAMAX) 25 MG tablet Take one tab po qhs for headaches   No facility-administered medications prior to visit.    Review of Systems  {Labs  Heme  Chem  Endocrine  Serology  Results Review (optional):23779}   Objective    LMP 09/06/2022 (Exact Date)  {Show previous vital signs (optional):23777}  Physical Exam  ***  No results found for any visits on 09/15/22.  Assessment & Plan     ***  No follow-ups on file.      {provider attestation***:1}   Mardene Speak, PA-C  [place] [phone] (phone) [phone] (fax)  [place]

## 2022-09-16 ENCOUNTER — Telehealth: Payer: Self-pay | Admitting: Physician Assistant

## 2022-09-16 DIAGNOSIS — N760 Acute vaginitis: Secondary | ICD-10-CM

## 2022-09-16 LAB — CERVICOVAGINAL ANCILLARY ONLY
Bacterial Vaginitis (gardnerella): POSITIVE — AB
Comment: NEGATIVE

## 2022-09-16 MED ORDER — METRONIDAZOLE 500 MG PO TABS: 500.0000 mg | ORAL_TABLET | Freq: Two times a day (BID) | ORAL | 0 refills | Status: AC

## 2022-09-16 NOTE — Telephone Encounter (Signed)
Interpreter request created

## 2022-09-16 NOTE — Telephone Encounter (Signed)
Please, advise pt that her labs were positive for bacterial vaginitis and she needs to pick up her medication. Advised to add to her diet yogurt / kefir. She needs to return back if symptoms persist/vaginal discharge. Please, contact her guardian, she cannot speak and understand [place]

## 2022-09-17 NOTE — Progress Notes (Signed)
Please, let pt know that her XR was negative for fracture, dislocation or joint effusion. Advised, keep it raise, use OTC antiinflammatory voltaren gel, use heat vs ice and take advil / tylenol with meals OTC for pain control She cannot speak [place]

## 2022-09-23 NOTE — Telephone Encounter (Signed)
Patient spouse stated it was not a good time to talk

## 2022-09-23 NOTE — Telephone Encounter (Signed)
Spoke to Beatrice(legal guardian)--regarding results and medications instructions. Diamond Nickel voiced understanding and  will forward the message to pt.

## 2022-09-25 ENCOUNTER — Telehealth: Payer: Self-pay | Admitting: Obstetrics and Gynecology

## 2022-09-25 NOTE — Telephone Encounter (Signed)
Left message for patient to call office back to r/s appt with ABC on 12/10/22. Helmut Muster will be off. Patient will need assistance through the interpreter line when she calls back she is pashto language.

## 2022-10-08 ENCOUNTER — Encounter: Payer: Self-pay | Admitting: Obstetrics and Gynecology

## 2022-10-08 NOTE — Telephone Encounter (Signed)
I contacted the patient using pacific interpreter Ahuad ID [phone]. I was able to rescheduled for 6/25 at 10:35 with [facility]. 40 minutes have been blocked for interpreters services.

## 2022-10-08 NOTE — Telephone Encounter (Signed)
The patient requested an appointment reminder to be mailed

## 2022-12-07 NOTE — Progress Notes (Unsigned)
Debera Lat, PA-C   No chief complaint on file.   HPI:      Ms. Kirsten Mccann is a 47 y.o. G11P0 whose LMP was No LMP recorded., presents today for f/u on menorrhagia, started aygestin  09/08/22 NOTE:  NP eval of irregular menses, referred by PCP. Menses are monthly, lasting 10 days, with 3 heavy days changing products every 2 hrs, with clots, bad at night. No BTB. Had worsening dysmen, LBP, with radiating leg pain for the 3 heavy days. Also with  n/v, constipation with menses. Periods worse past 6 yrs. Had neg reproductive CT abd/pelvis 7/23. Normal TSH and CBC 1/24 with PCP. Neg pap/neg HPV DNA 2/24. Pt is sexually active, not using BC.  Has hx of migraines, takes topamax.   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  Social History   Socioeconomic History   Marital status: Married    Spouse name: Not on file   Number of children: Not on file   Years of education: Not on file   Highest education level: Not on file  Occupational History   Not on file  Tobacco Use   Smoking status: Never   Smokeless tobacco: Never  Vaping Use   Vaping Use: Never used  Substance and Sexual Activity   Alcohol use: Never   Drug use: Never   Sexual activity: Yes    Birth control/protection: None  Other  Topics Concern   Not on file  Social History Narrative   Not on file   Social Determinants of Health   Financial Resource Strain: Not on file  Food Insecurity: Not on file  Transportation Needs: Not on file  Physical Activity: Not on file  Stress: Not on file  Social Connections: Not on file  Intimate Partner Violence: Not on file    Outpatient Medications Prior to Visit  Medication Sig Dispense Refill   butalbital-apap-caffeine-codeine (FIORICET WITH CODEINE) 50-325-40-30 MG capsule TAKE ONE TAB FOR MIGRAINE HEADACHES ONLY AS NEEDED QD 30 capsule 0   DULoxetine (CYMBALTA) 20 MG capsule Take 2 capsules (40 mg total) by mouth daily. 180 capsule 0   famotidine (PEPCID) 20 MG tablet Take one tab po qd for stomach at lunch 90 tablet 3   norethindrone (AYGESTIN) 5 MG tablet Take 1 tablet (5 mg total) by mouth daily. 90 tablet 0   rosuvastatin (CRESTOR) 5 MG tablet Take 1 tablet (5 mg total) by mouth daily. 90 tablet 0   sitaGLIPtin (JANUVIA) 100 MG tablet Take 1 tablet (100 mg total) by mouth daily. 90 tablet 0   topiramate (TOPAMAX) 25 MG tablet Take one tab po qhs for headaches 90 tablet 1   No facility-administered medications prior to visit.      ROS:  Review of Systems BREAST: No symptoms   OBJECTIVE:  Vitals:  There were no vitals taken for this visit.  Physical Exam  Results: No results found for this or any previous visit (from the past 24 hour(s)).   Assessment/Plan: No diagnosis found.    No orders of the defined types were placed in this encounter.     No follow-ups on file.  Dannell Gortney B. Kyliegh Jester, PA-C 12/07/2022 5:01 PM

## 2022-12-08 ENCOUNTER — Encounter: Payer: Self-pay | Admitting: Obstetrics and Gynecology

## 2022-12-08 ENCOUNTER — Ambulatory Visit (INDEPENDENT_AMBULATORY_CARE_PROVIDER_SITE_OTHER): Payer: Medicaid Other | Admitting: Obstetrics and Gynecology

## 2022-12-08 DIAGNOSIS — N921 Excessive and frequent menstruation with irregular cycle: Secondary | ICD-10-CM

## 2022-12-08 MED ORDER — NORETHINDRONE ACETATE 5 MG PO TABS: 5.0000 mg | ORAL_TABLET | Freq: Every day | ORAL | 0 refills | Status: DC

## 2022-12-08 MED ORDER — IBUPROFEN 600 MG PO TABS: 600.0000 mg | ORAL_TABLET | Freq: Three times a day (TID) | ORAL | 1 refills | Status: DC | PRN

## 2022-12-08 NOTE — Patient Instructions (Signed)
I value your feedback and you entrusting us with your care. If you get a [facility] patient survey, I would appreciate you taking the time to let us know about your experience today. Thank you! ? ? ?

## 2022-12-10 ENCOUNTER — Ambulatory Visit: Payer: Medicaid Other | Admitting: Obstetrics and Gynecology

## 2022-12-11 ENCOUNTER — Ambulatory Visit: Payer: Medicaid Other | Admitting: Physician Assistant

## 2022-12-11 NOTE — Progress Notes (Deleted)
Established patient visit  Patient: Kirsten Mccann   DOB: 1976-04-02   47 y.o. Female  MRN: [id number] Visit Date: 12/11/2022  Today's healthcare provider: Debera Lat, PA-C   No chief complaint on file.  Subjective    HPI  *** Discussed the use of AI scribe software for clinical note transcription with the patient, who gave verbal consent to proceed.  History of Present Illness               09/15/2022   10:30 AM  Depression screen PHQ 2/9  Decreased Interest 3  Down, Depressed, Hopeless 2  PHQ - 2 Score 5  Altered sleeping 2  Tired, decreased energy 1  Change in appetite 0  Feeling bad or failure about yourself  2  Trouble concentrating 0  Moving slowly or fidgety/restless 1  Suicidal thoughts 0  PHQ-9 Score 11  Difficult doing work/chores Not difficult at all      09/15/2022   10:43 AM  GAD 7 : Generalized Anxiety Score  Nervous, Anxious, on Edge 3  Control/stop worrying 3  Worry too much - different things 3  Trouble relaxing 0  Restless 3  Easily annoyed or irritable 3  Afraid - awful might happen 3  Total GAD 7 Score 18  Anxiety Difficulty Somewhat difficult    Medications: Outpatient Medications Prior to Visit  Medication Sig   butalbital-apap-caffeine-codeine (FIORICET WITH CODEINE) 50-325-40-30 MG capsule TAKE ONE TAB FOR MIGRAINE HEADACHES ONLY AS NEEDED QD   DULoxetine (CYMBALTA) 20 MG capsule Take 2 capsules (40 mg total) by mouth daily.   famotidine (PEPCID) 20 MG tablet Take one tab po qd for stomach at lunch   ibuprofen (ADVIL) 600 MG tablet Take 1 tablet (600 mg total) by mouth every 8 (eight) hours as needed for moderate pain.   norethindrone (AYGESTIN) 5 MG tablet Take 1 tablet (5 mg total) by mouth daily.   rosuvastatin (CRESTOR) 5 MG tablet Take 1 tablet (5 mg total) by mouth daily.   sitaGLIPtin (JANUVIA) 100 MG tablet Take 1 tablet (100 mg total) by mouth daily.   topiramate (TOPAMAX) 25 MG tablet Take one tab po qhs for headaches    No facility-administered medications prior to visit.    Review of Systems  All other systems reviewed and are negative.  Except see HPI   {Labs  Heme  Chem  Endocrine  Serology  Results Review (optional):23779}   Objective    LMP 11/09/2022 (Approximate)  {Show previous vital signs (optional):23777}  Physical Exam Constitutional:      General: She is not in acute distress.    Appearance: Normal appearance.  HENT:     Head: Normocephalic.  Pulmonary:     Effort: Pulmonary effort is normal. No respiratory distress.  Skin:    Findings: Rash present.  Neurological:     Mental Status: She is alert and oriented to person, place, and time. Mental status is at baseline.      No results found for any visits on 12/11/22.  Assessment & Plan    *** Assessment and Plan              No follow-ups on file.     The patient was advised to call back or seek an in-person evaluation if the symptoms worsen or if the condition fails to improve as anticipated.  I discussed the assessment and treatment plan with the patient. The patient was provided an opportunity to ask questions and all were answered. The patient  agreed with the plan and demonstrated an understanding of the instructions.  I, Debera Lat, PA-C have reviewed all documentation for this visit. The documentation on 12/11/2022 for the exam, diagnosis, procedures, and orders are all accurate and complete.  Debera Lat, [employer], MMS [facility] [phone] (phone) [phone] (fax)  [facility]

## 2022-12-15 ENCOUNTER — Ambulatory Visit: Payer: Medicaid Other | Admitting: Physician Assistant

## 2023-01-31 ENCOUNTER — Other Ambulatory Visit: Payer: Self-pay | Admitting: Family Medicine

## 2023-01-31 DIAGNOSIS — E0859 Diabetes mellitus due to underlying condition with other circulatory complications: Secondary | ICD-10-CM

## 2023-02-04 ENCOUNTER — Encounter: Payer: Self-pay | Admitting: Physician Assistant

## 2023-02-04 ENCOUNTER — Ambulatory Visit (INDEPENDENT_AMBULATORY_CARE_PROVIDER_SITE_OTHER): Payer: Medicaid Other | Admitting: Physician Assistant

## 2023-02-04 VITALS — BP 128/65 | HR 64 | Ht 63.0 in | Wt 177.1 lb

## 2023-02-04 DIAGNOSIS — Z1211 Encounter for screening for malignant neoplasm of colon: Secondary | ICD-10-CM | POA: Diagnosis not present

## 2023-02-04 DIAGNOSIS — F4310 Post-traumatic stress disorder, unspecified: Secondary | ICD-10-CM

## 2023-02-04 DIAGNOSIS — E669 Obesity, unspecified: Secondary | ICD-10-CM

## 2023-02-04 DIAGNOSIS — L299 Pruritus, unspecified: Secondary | ICD-10-CM | POA: Diagnosis not present

## 2023-02-04 DIAGNOSIS — E0859 Diabetes mellitus due to underlying condition with other circulatory complications: Secondary | ICD-10-CM

## 2023-02-04 MED ORDER — CETIRIZINE HCL 10 MG PO TABS
10.0000 mg | ORAL_TABLET | Freq: Every day | ORAL | 11 refills | Status: AC
Start: 2023-02-04 — End: ?

## 2023-02-04 MED ORDER — HYDROXYZINE PAMOATE 25 MG PO CAPS: ORAL_CAPSULE | ORAL | 0 refills | Status: DC
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
## 2023-02-05 LAB — TSH: TSH: 0.479 u[IU]/mL (ref 0.450–4.500)

## 2023-02-05 LAB — COMPREHENSIVE METABOLIC PANEL
ALT: 15 IU/L (ref 0–32)
AST: 16 IU/L (ref 0–40)
Albumin: 4.4 g/dL (ref 3.9–4.9)
Alkaline Phosphatase: 54 IU/L (ref 44–121)
BUN/Creatinine Ratio: 16 (ref 9–23)
BUN: 11 mg/dL (ref 6–24)
Bilirubin Total: <0.2 mg/dL (ref 0.0–1.2)
CO2: 20 mmol/L (ref 20–29)
Calcium: 8.8 mg/dL (ref 8.7–10.2)
Chloride: 104 mmol/L (ref 96–106)
Creatinine, Ser: 0.67 mg/dL (ref 0.57–1.00)
Globulin, Total: 2.9 g/dL (ref 1.5–4.5)
Glucose: 103 mg/dL — ABNORMAL HIGH (ref 70–99)
Potassium: 4.2 mmol/L (ref 3.5–5.2)
Sodium: 137 mmol/L (ref 134–144)
Total Protein: 7.3 g/dL (ref 6.0–8.5)
eGFR: 110 mL/min/{1.73_m2} (ref >59)

## 2023-02-05 LAB — CBC WITH DIFFERENTIAL/PLATELET
Basophils Absolute: 0 10*3/uL (ref 0.0–0.2)
Basos: 1 %
EOS (ABSOLUTE): 0.1 10*3/uL (ref 0.0–0.4)
Eos: 1 %
Hematocrit: 38.6 % (ref 34.0–46.6)
Hemoglobin: 12.5 g/dL (ref 11.1–15.9)
Immature Grans (Abs): 0 10*3/uL (ref 0.0–0.1)
Immature Granulocytes: 0 %
Lymphocytes Absolute: 2.4 10*3/uL (ref 0.7–3.1)
Lymphs: 38 %
MCH: 27.2 pg (ref 26.6–33.0)
MCHC: 32.4 g/dL (ref 31.5–35.7)
MCV: 84 fL (ref 79–97)
Monocytes Absolute: 0.4 10*3/uL (ref 0.1–0.9)
Monocytes: 7 %
Neutrophils Absolute: 3.3 10*3/uL (ref 1.4–7.0)
Neutrophils: 53 %
Platelets: 260 10*3/uL (ref 150–450)
RBC: 4.6 x10E6/uL (ref 3.77–5.28)
RDW: 13 % (ref 11.7–15.4)
WBC: 6.3 10*3/uL (ref 3.4–10.8)

## 2023-02-05 LAB — LIPID PANEL
Chol/HDL Ratio: 6.9 ratio — ABNORMAL HIGH (ref 0.0–4.4)
Cholesterol, Total: 200 mg/dL — ABNORMAL HIGH (ref 100–199)
HDL: 29 mg/dL — ABNORMAL LOW (ref >39)
LDL Chol Calc (NIH): 125 mg/dL — ABNORMAL HIGH (ref 0–99)
Triglycerides: 261 mg/dL — ABNORMAL HIGH (ref 0–149)
VLDL Cholesterol Cal: 46 mg/dL — ABNORMAL HIGH (ref 5–40)

## 2023-02-05 LAB — HEMOGLOBIN A1C
Est. average glucose Bld gHb Est-mCnc: 154 mg/dL
Hgb A1c MFr Bld: 7 % — ABNORMAL HIGH (ref 4.8–5.6)

## 2023-02-06 DIAGNOSIS — F4310 Post-traumatic stress disorder, unspecified: Secondary | ICD-10-CM | POA: Insufficient documentation

## 2023-02-06 DIAGNOSIS — E669 Obesity, unspecified: Secondary | ICD-10-CM | POA: Insufficient documentation

## 2023-02-06 DIAGNOSIS — L299 Pruritus, unspecified: Secondary | ICD-10-CM | POA: Insufficient documentation

## 2023-02-08 NOTE — Progress Notes (Signed)
(   Interpreter: Pashto) All labs normal/stable for her. Except elevated cholesterol. Advised low cholesterol diet and exercise. The 10-year ASCVD risk score (risk for heart attack and stroke) is: 4.3%, low.Except her A1C of 7.0. Needs to continue taking her diabetic medication, adhere to low carb diet and exercise.

## 2023-02-09 ENCOUNTER — Other Ambulatory Visit: Payer: Self-pay | Admitting: Student

## 2023-02-09 DIAGNOSIS — R519 Headache, unspecified: Secondary | ICD-10-CM

## 2023-02-20 LAB — COLOGUARD

## 2023-02-22 ENCOUNTER — Other Ambulatory Visit: Payer: Self-pay

## 2023-02-22 DIAGNOSIS — Z1211 Encounter for screening for malignant neoplasm of colon: Secondary | ICD-10-CM

## 2023-02-22 NOTE — Progress Notes (Signed)
Needs an Interpreter pashto Pt needs to redo cologuard as a sample was not properly done.

## 2023-03-01 ENCOUNTER — Ambulatory Visit: Payer: Self-pay | Admitting: *Deleted

## 2023-03-01 NOTE — Telephone Encounter (Signed)
  Chief Complaint: Abdominal pain Symptoms: Upper abdominal pain, comes and goes, worse after eating. Frequency: 4-5 days Pertinent Negatives: Patient denies nausea, vomiting, diarrhea Disposition: [] ED /[] Urgent Care (no appt availability in office) / [x] Appointment(In office/virtual)/ []  [facility]/ [] Home Care/ [] Refused Recommended Disposition /[] [facility]/ []  Follow-up with PCP Additional Notes: Appt secured for tomorrow. Care advise provided. Pt verbalizes understanding.  Son Imar interpreting for pt . Obtained permission from pt.  Reason for Disposition  [1] MODERATE pain (e.g., interferes with normal activities) AND [2] comes and goes (cramps) AND [3] present > 24 hours  (Exception: Pain with Vomiting or Diarrhea - see that Guideline.)  Answer Assessment - Initial Assessment Questions 1. LOCATION: "Where does it hurt?"      "Above belly button."  2. RADIATION: "Does the pain shoot anywhere else?" (e.g., chest, back)     "Goes up after eating" 3. ONSET: "When did the pain begin?" (e.g., minutes, hours or days ago)      4-5 days 4. SUDDEN: "Gradual or sudden onset?"     Unsure 5. PATTERN "Does the pain come and go, or is it constant?"    - If it comes and goes: "How long does it last?" "Do you have pain now?"     (Note: Comes and goes means the pain is intermittent. It goes away completely between bouts.)    - If constant: "Is it getting better, staying the same, or getting worse?"      (Note: Constant means the pain never goes away completely; most serious pain is constant and gets worse.)      Constant 6. SEVERITY: "How bad is the pain?"  (e.g., Scale 1-10; mild, moderate, or severe)    - MILD (1-3): Doesn't interfere with normal activities, abdomen soft and not tender to touch..     - MODERATE (4-7): Interferes with normal activities or awakens from sleep, abdomen tender to touch.     - SEVERE (8-10): Excruciating pain, doubled over, unable to do any  normal activities.       8/10 at times  8. AGGRAVATING FACTORS: "Does anything seem to cause this pain?" (e.g., foods, stress, alcohol)     Eating  10. OTHER SYMPTOMS: "Do you have any other symptoms?" (e.g., back pain, diarrhea, fever, urination pain, vomiting)       PAin worse after eating,constipation, (States LBM today)  Protocols used: Abdominal Pain - Upper-A-AH

## 2023-03-02 ENCOUNTER — Other Ambulatory Visit (HOSPITAL_COMMUNITY)
Admission: RE | Admit: 2023-03-02 | Discharge: 2023-03-02 | Disposition: A | Discharge status: Home / Self Care | Payer: Medicaid Other | Source: Ambulatory Visit | Attending: Physician Assistant | Admitting: Physician Assistant

## 2023-03-02 ENCOUNTER — Ambulatory Visit (INDEPENDENT_AMBULATORY_CARE_PROVIDER_SITE_OTHER): Payer: Medicaid Other | Admitting: Physician Assistant

## 2023-03-02 VITALS — BP 155/92 | HR 71 | Temp 98.3°F | Ht 63.0 in | Wt 172.0 lb

## 2023-03-02 DIAGNOSIS — R1030 Lower abdominal pain, unspecified: Secondary | ICD-10-CM | POA: Diagnosis present

## 2023-03-02 DIAGNOSIS — R030 Elevated blood-pressure reading, without diagnosis of hypertension: Secondary | ICD-10-CM

## 2023-03-02 DIAGNOSIS — Z23 Encounter for immunization: Secondary | ICD-10-CM

## 2023-03-02 DIAGNOSIS — N921 Excessive and frequent menstruation with irregular cycle: Secondary | ICD-10-CM

## 2023-03-02 DIAGNOSIS — N898 Other specified noninflammatory disorders of vagina: Secondary | ICD-10-CM | POA: Diagnosis present

## 2023-03-02 LAB — POCT URINALYSIS DIPSTICK
Bilirubin, UA: NEGATIVE
Blood, UA: NEGATIVE
Glucose, UA: NEGATIVE
Ketones, UA: NEGATIVE
Leukocytes, UA: NEGATIVE
Nitrite, UA: NEGATIVE
Protein, UA: NEGATIVE
Spec Grav, UA: 1.02 (ref 1.010–1.025)
Urobilinogen, UA: 0.2 U/dL
pH, UA: 6.5 (ref 5.0–8.0)

## 2023-03-02 MED ORDER — LINACLOTIDE 72 MCG PO CAPS: 72.0000 ug | ORAL_CAPSULE | Freq: Every day | ORAL | 0 refills | Status: DC

## 2023-03-02 NOTE — Progress Notes (Unsigned)
Established patient visit  Patient: Kirsten Mccann   DOB: [birth year]   47 y.o. Female  MRN: [id number] Visit Date: 03/02/2023  Today's healthcare provider: Debera Lat, PA-C   Chief Complaint  Patient presents with   Abdominal Pain    Patient has had abdominal pain for 5-10 days.  She also complains of decreased appetite and nausea.   Subjective    HPI HPI     Abdominal Pain    Additional comments: Patient has had abdominal pain for 5-10 days.  She also complains of decreased appetite and nausea.        Comments   Patient also reports that she has been having trouble with the norethin that was prescribed for her.       Last edited by Adline Peals, CMA on 03/02/2023  8:24 AM.      *** Discussed the use of AI scribe software for clinical note transcription with the patient, who gave verbal consent to proceed.  History of Present Illness          Qasim #[id number]     09/15/2022   10:30 AM  Depression screen PHQ 2/9  Decreased Interest 3  Down, Depressed, Hopeless 2  PHQ - 2 Score 5  Altered sleeping 2  Tired, decreased energy 1  Change in appetite 0  Feeling bad or failure about yourself  2  Trouble concentrating 0  Moving slowly or fidgety/restless 1  Suicidal thoughts 0  PHQ-9 Score 11  Difficult doing work/chores Not difficult at all      09/15/2022   10:43 AM  GAD 7 : Generalized Anxiety Score  Nervous, Anxious, on Edge 3  Control/stop worrying 3  Worry too much - different things 3  Trouble relaxing 0  Restless 3  Easily annoyed or irritable 3  Afraid - awful might happen 3  Total GAD 7 Score 18  Anxiety Difficulty Somewhat difficult    Medications: Outpatient Medications Prior to Visit  Medication Sig   cetirizine (ZYRTEC) 10 MG tablet Take 1 tablet (10 mg total) by mouth daily.   DULoxetine (CYMBALTA) 20 MG capsule Take 2 capsules (40 mg total) by mouth daily.   famotidine (PEPCID) 20 MG tablet Take one tab po qd for stomach at lunch    hydrOXYzine (VISTARIL) 25 MG capsule Take 1 tab once daily by mouth at bedtime   norethindrone (AYGESTIN) 5 MG tablet Take 1 tablet (5 mg total) by mouth daily.   rosuvastatin (CRESTOR) 5 MG tablet Take 1 tablet (5 mg total) by mouth daily.   sitaGLIPtin (JANUVIA) 100 MG tablet Take 1 tablet by mouth once daily   topiramate (TOPAMAX) 25 MG tablet Take one tab po qhs for headaches   butalbital-apap-caffeine-codeine (FIORICET WITH CODEINE) 50-325-40-30 MG capsule TAKE ONE TAB FOR MIGRAINE HEADACHES ONLY AS NEEDED QD (Patient not taking: Reported on 02/04/2023)   ibuprofen (ADVIL) 600 MG tablet Take 1 tablet (600 mg total) by mouth every 8 (eight) hours as needed for moderate pain. (Patient not taking: Reported on 02/04/2023)   No facility-administered medications prior to visit.    Review of Systems Except see HPI   {Insert previous labs (optional):23779} {See past labs  Heme  Chem  Endocrine  Serology  Results Review (optional):1}   Objective    BP (!) 155/92 (BP Location: Right Arm, Patient Position: Sitting, Cuff Size: Normal)   Pulse 71   Temp 98.3 F (36.8 C) (Oral)   Ht 5\' 3"  (1.6 m)   Wt 172  lb (78 kg)   SpO2 99%   BMI 30.47 kg/m  {Insert last BP/Wt (optional):23777}{See vitals history (optional):1}   Physical Exam   No results found for any visits on 03/02/23.  Assessment & Plan    *** Assessment and Plan              No follow-ups on file.      [facility]

## 2023-03-03 ENCOUNTER — Encounter: Payer: Self-pay | Admitting: Physician Assistant

## 2023-03-03 DIAGNOSIS — R030 Elevated blood-pressure reading, without diagnosis of hypertension: Secondary | ICD-10-CM | POA: Insufficient documentation

## 2023-03-03 DIAGNOSIS — R1030 Lower abdominal pain, unspecified: Secondary | ICD-10-CM | POA: Insufficient documentation

## 2023-03-04 ENCOUNTER — Other Ambulatory Visit: Payer: Self-pay | Admitting: Physician Assistant

## 2023-03-04 DIAGNOSIS — N760 Acute vaginitis: Secondary | ICD-10-CM

## 2023-03-04 DIAGNOSIS — B3731 Acute candidiasis of vulva and vagina: Secondary | ICD-10-CM

## 2023-03-04 LAB — CERVICOVAGINAL ANCILLARY ONLY
Bacterial Vaginitis (gardnerella): POSITIVE — AB
Candida Glabrata: NEGATIVE
Candida Vaginitis: POSITIVE — AB
Chlamydia: NEGATIVE
Comment: NEGATIVE
Comment: NEGATIVE
Comment: NEGATIVE
Comment: NEGATIVE
Comment: NEGATIVE
Comment: NORMAL
Neisseria Gonorrhea: NEGATIVE
Trichomonas: NEGATIVE

## 2023-03-04 MED ORDER — FLUCONAZOLE 150 MG PO TABS
150.0000 mg | ORAL_TABLET | Freq: Once | ORAL | 2 refills | Status: AC
Start: 2023-03-04 — End: 2023-03-04

## 2023-03-04 MED ORDER — METRONIDAZOLE 500 MG PO TABS
500.0000 mg | ORAL_TABLET | Freq: Two times a day (BID) | ORAL | 0 refills | Status: AC
Start: 2023-03-04 — End: 2023-03-11

## 2023-03-04 NOTE — Progress Notes (Signed)
(  Interpreter pashto needed) Please, let pt know that medications will be called in for her. She has bacterial vaginitis and yeast infection. Medications should be taken after or with meals. Including yogurt.

## 2023-03-06 LAB — URINE CULTURE

## 2023-03-08 NOTE — Progress Notes (Signed)
(  Interpreter pashto)Urine culture negative. Advised to drink plenty of fluids Will discuss the rest during her next appt

## 2023-03-09 ENCOUNTER — Encounter: Payer: Self-pay | Admitting: *Deleted

## 2023-03-09 ENCOUNTER — Encounter: Payer: Self-pay | Admitting: Obstetrics and Gynecology

## 2023-03-09 ENCOUNTER — Ambulatory Visit: Payer: Medicaid Other | Admitting: Obstetrics and Gynecology

## 2023-03-09 VITALS — BP 132/80 | Wt 177.0 lb

## 2023-03-09 DIAGNOSIS — N921 Excessive and frequent menstruation with irregular cycle: Secondary | ICD-10-CM

## 2023-03-09 DIAGNOSIS — N946 Dysmenorrhea, unspecified: Secondary | ICD-10-CM | POA: Diagnosis not present

## 2023-03-09 DIAGNOSIS — N939 Abnormal uterine and vaginal bleeding, unspecified: Secondary | ICD-10-CM

## 2023-03-09 NOTE — Progress Notes (Signed)
Debera Lat, PA-C   Chief Complaint  Patient presents with   Follow-up    Bleeding is less than before    HPI:      Kirsten Mccann is a 47 y.o. G11P0 whose LMP was Patient's last menstrual period was 02/27/2023 (exact date)., presents today for f/u on menometrorrhagia from 3/24 and 6/24, started aygestin. Menses still monthly, were lasting 4-5 days instead of 10 at 6/24 appt but now back up to 10-12 days. Spotting only now for whole time; heavy flow resolved; no BTB. Dysmen improved; never picked up ibup Rx from 6/24 appt.  Is having GI cramping after eating, causing increased gas, sometimes diarrhea. Hx of migraines.  From [place].   PT NEEDS INTERPRETER  09/08/22 NOTE:  NP eval of irregular menses, referred by PCP. Menses are monthly, lasting 10 days, with 3 heavy days changing products every 2 hrs, with clots, bad at night. No BTB. Had worsening dysmen, LBP, with radiating leg pain for the 3 heavy days. Also with  n/v, constipation with menses. Periods worse past 6 yrs. Had neg reproductive CT abd/pelvis 7/23. Normal TSH and CBC 1/24 with PCP. Neg pap/neg HPV DNA 2/24. Pt is sexually active, not using BC.  Has hx of migraines, takes topamax.   Patient Active Problem List   Diagnosis Date Noted   Elevated BP without diagnosis of hypertension 03/03/2023   Lower abdominal pain 03/03/2023   Pruritic condition 02/06/2023   PTSD (post-traumatic stress disorder) 02/06/2023   Obesity (BMI 30.0-34.9) 02/06/2023   Pap smear for cervical cancer screening 07/31/2022   Adnexal tenderness 07/31/2022   Vaginal discharge 07/31/2022   Menorrhagia with irregular cycle 07/31/2022   Generalized pain 07/31/2022   Diabetes mellitus (HCC) 07/31/2022   Type 2 diabetes mellitus without complication (HCC) 07/02/2022   Anxiety and depression 07/02/2022   Psychosomatic pain 07/02/2022   Migraine 07/02/2022   Hyperlipidemia associated with type 2 diabetes mellitus (HCC) 07/02/2022    Establishing care with new doctor, encounter for 07/02/2022   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 No pertinent family history.  Social History   Socioeconomic History   Marital status: Married    Spouse name: Not on file   Number of children: Not on file   Years of education: Not on file   Highest education level: Not on file  Occupational History   Not on file  Tobacco Use   Smoking status: Never   Smokeless tobacco: Never  Vaping Use   Vaping status: Never Used  Substance and Sexual Activity   Alcohol use: Never   Drug use: Never   Sexual activity: Yes    Birth control/protection: None  Other Topics Concern   Not on file  Social History Narrative   Not on file   Social Determinants of Health   Financial Resource Strain: Not on file  Food Insecurity: Not on file  Transportation Needs: Not on file  Physical Activity: Not on file  Stress: Not on file  Social Connections: Not on file  Intimate Partner Violence: Not on file    Outpatient Medications Prior to Visit  Medication Sig Dispense Refill   butalbital-apap-caffeine-codeine (FIORICET WITH CODEINE) 50-325-40-30 MG capsule TAKE ONE TAB FOR MIGRAINE HEADACHES ONLY AS NEEDED QD 30 capsule 0   cetirizine (ZYRTEC) 10 MG tablet Take 1 tablet (10 mg total) by mouth daily. 30  tablet 11   DULoxetine (CYMBALTA) 20 MG capsule Take 2 capsules (40 mg total) by mouth daily. 180 capsule 0   famotidine (PEPCID) 20 MG tablet Take one tab po qd for stomach at lunch 90 tablet 3   ferrous sulfate 325 (65 FE) MG tablet Take by mouth.     hydrOXYzine (VISTARIL) 25 MG capsule Take 1 tab once daily by mouth at bedtime 30 capsule 0   ibuprofen (ADVIL) 600 MG tablet Take 1 tablet (600 mg total) by mouth every 8 (eight) hours as needed for moderate pain. 30 tablet 1   linaclotide (LINZESS) 72 MCG capsule Take 1 capsule (72 mcg  total) by mouth daily before breakfast. 30 capsule 0   metroNIDAZOLE (FLAGYL) 500 MG tablet Take 1 tablet (500 mg total) by mouth 2 (two) times daily for 7 days. 14 tablet 0   norethindrone (AYGESTIN) 5 MG tablet Take 1 tablet (5 mg total) by mouth daily. 90 tablet 0   rosuvastatin (CRESTOR) 5 MG tablet Take 1 tablet (5 mg total) by mouth daily. 90 tablet 0   sitaGLIPtin (JANUVIA) 100 MG tablet Take 1 tablet by mouth once daily 30 tablet 0   topiramate (TOPAMAX) 25 MG tablet Take one tab po qhs for headaches 90 tablet 1   topiramate (TOPAMAX) 50 MG tablet Take 1 tablet by mouth 2 (two) times daily.     No facility-administered medications prior to visit.      ROS:  Review of Systems  Constitutional:  Negative for fever.  Gastrointestinal:  Positive for diarrhea. Negative for blood in stool, constipation, nausea and vomiting.  Genitourinary:  Positive for menstrual problem. Negative for dyspareunia, dysuria, flank pain, frequency, hematuria, pelvic pain, urgency, vaginal bleeding, vaginal discharge and vaginal pain.  Musculoskeletal:  Negative for back pain.  Skin:  Negative for rash.   BREAST: No symptoms   OBJECTIVE:   Vitals:  BP 132/80   Wt 177 lb (80.3 kg)   LMP 02/27/2023 (Exact Date)   BMI 31.35 kg/m   Physical Exam Vitals reviewed.  Constitutional:      Appearance: She is well-developed.  Pulmonary:     Effort: Pulmonary effort is normal.  Musculoskeletal:        General: Normal range of motion.     Cervical back: Normal range of motion.  Skin:    General: Skin is warm and dry.  Neurological:     General: No focal deficit present.     Mental Status: She is alert and oriented to person, place, and time.     Cranial Nerves: No cranial nerve deficit.  Psychiatric:        Mood and Affect: Mood normal.        Behavior: Behavior normal.        Thought Content: Thought content normal.        Judgment: Judgment normal.     Assessment/Plan: Abnormal uterine  bleeding (AUB) - Plan: US PELVIC COMPLETE WITH TRANSVAGINAL; menometrorrhagia resolved, now with 10-12 days spotting with aygestin. Most likely normal but check GYN u/s. Will f/u with results. If WNL, will RF aygestin for yr.   Dysmenorrhea - Plan: US PELVIC COMPLETE WITH TRANSVAGINAL; sx improved with aygestin, never took Rx ibup. If GYN u/s WNL, reassurance.   Menometrorrhagia--resolved  PT NEEDS INTERPRETER   Return if symptoms worsen or fail to improve.  Aerica Rincon B. Keileigh Vahey, PA-C 03/09/2023 10:57 AM

## 2023-03-11 ENCOUNTER — Encounter: Payer: Self-pay | Admitting: Student

## 2023-03-11 LAB — COLOGUARD

## 2023-03-14 NOTE — Progress Notes (Unsigned)
Established patient visit  Patient: Kirsten Mccann   DOB: 1976-04-28   47 y.o. Female  MRN: [id number] Visit Date: 03/18/2023  Today's healthcare provider: Debera Lat, PA-C   Chief Complaint  Patient presents with   Medical Management of Chronic Issues    2 week follow up, has pain in the belly, possible arthritis, itching all over that has been going on for 3 months, joints in fingers are stiff, especially in between, Also itches in between fingers, no prior treatment, medication for heavy menses has been helping, pain still exists, pain is worse when it returns    Subjective     Discussed the use of AI scribe software for clinical note transcription with the patient, who gave verbal consent to proceed.  History of Present Illness   Kirsten Mccann, a patient with a history of high blood pressure, diabetes, and gynecological issues, presents with generalized itchiness, particularly on the arms and back. The itchiness is more pronounced at night. The patient also reports abdominal pain and headaches. The patient's blood pressure was previously elevated but has normalized, possibly due to increased physical activity in the form of walking. The patient has been prescribed various medications for her conditions, including antihistamines for itchiness, pain medication, medication for abdominal spasms, medication for diabetes, and medication for cholesterol management. The patient has also been prescribed medication by a gynecologist to manage menstrual issues.    Kirsten Mccann- interpreter  pashto       09/15/2022   10:30 AM  Depression screen PHQ 2/9  Decreased Interest 3  Down, Depressed, Hopeless 2  PHQ - 2 Score 5  Altered sleeping 2  Tired, decreased energy 1  Change in appetite 0  Feeling bad or failure about yourself  2  Trouble concentrating 0  Moving slowly or fidgety/restless 1  Suicidal thoughts 0  PHQ-9 Score 11  Difficult doing work/chores Not difficult at all      09/15/2022   10:43  AM  GAD 7 : Generalized Anxiety Score  Nervous, Anxious, on Edge 3  Control/stop worrying 3  Worry too much - different things 3  Trouble relaxing 0  Restless 3  Easily annoyed or irritable 3  Afraid - awful might happen 3  Total GAD 7 Score 18  Anxiety Difficulty Somewhat difficult    Medications: Outpatient Medications Prior to Visit  Medication Sig   butalbital-apap-caffeine-codeine (FIORICET WITH CODEINE) 50-325-40-30 MG capsule TAKE ONE TAB FOR MIGRAINE HEADACHES ONLY AS NEEDED QD   DULoxetine (CYMBALTA) 20 MG capsule Take 2 capsules (40 mg total) by mouth daily.   famotidine (PEPCID) 20 MG tablet Take one tab po qd for stomach at lunch   ferrous sulfate 325 (65 FE) MG tablet Take by mouth.   ibuprofen (ADVIL) 600 MG tablet Take 1 tablet (600 mg total) by mouth every 8 (eight) hours as needed for moderate pain.   linaclotide (LINZESS) 72 MCG capsule Take 1 capsule (72 mcg total) by mouth daily before breakfast.   norethindrone (AYGESTIN) 5 MG tablet Take 1 tablet (5 mg total) by mouth daily.   rosuvastatin (CRESTOR) 5 MG tablet Take 1 tablet (5 mg total) by mouth daily.   sitaGLIPtin (JANUVIA) 100 MG tablet Take 1 tablet by mouth once daily   topiramate (TOPAMAX) 25 MG tablet Take one tab po qhs for headaches   topiramate (TOPAMAX) 50 MG tablet Take 1 tablet by mouth 2 (two) times daily.   [DISCONTINUED] cetirizine (ZYRTEC) 10 MG tablet Take 1 tablet (10 mg total)  by mouth daily.   [DISCONTINUED] hydrOXYzine (VISTARIL) 25 MG capsule Take 1 tab once daily by mouth at bedtime   No facility-administered medications prior to visit.    Review of Systems  All other systems reviewed and are negative.  Except see HPI   {Insert previous labs (optional):23779} {See past labs  Heme  Chem  Endocrine  Serology  Results Review (optional):1}   Objective    BP 131/69 (BP Location: Left Arm, Patient Position: Sitting, Cuff Size: Large)   Pulse 60   Ht 5\' 3"  (1.6 m)   Wt 175 lb  1.6 oz (79.4 kg)   LMP 02/27/2023 (Exact Date)   SpO2 100%   BMI 31.02 kg/m  {Insert last BP/Wt (optional):23777}{See vitals history (optional):1}   Physical Exam Vitals reviewed.  Constitutional:      General: She is not in acute distress.    Appearance: Normal appearance. She is well-developed. She is not diaphoretic.  HENT:     Head: Normocephalic and atraumatic.  Eyes:     General: No scleral icterus.    Conjunctiva/sclera: Conjunctivae normal.  Neck:     Thyroid: No thyromegaly.  Cardiovascular:     Rate and Rhythm: Normal rate and regular rhythm.     Pulses: Normal pulses.     Heart sounds: Normal heart sounds. No murmur heard. Pulmonary:     Effort: Pulmonary effort is normal. No respiratory distress.     Breath sounds: Normal breath sounds. No wheezing, rhonchi or rales.  Musculoskeletal:     Cervical back: Neck supple.     Right lower leg: No edema.     Left lower leg: No edema.  Lymphadenopathy:     Cervical: No cervical adenopathy.  Skin:    General: Skin is warm and dry.     Findings: Rash present.  Neurological:     Mental Status: She is alert and oriented to person, place, and time. Mental status is at baseline.  Psychiatric:        Mood and Affect: Mood normal.        Behavior: Behavior normal.      No results found for any visits on 03/18/23.  Assessment & Plan        Hypertension Blood pressure was high at last visit, but improved today. Patient has been walking for exercise. No antihypertensive medications started yet. -Advise patient to check blood pressure at local pharmacies ([facility], [facility], [facility]) and record the readings for future visits.  Pruritus Patient reports generalized itching, particularly at night. No visible skin lesions except for scratch marks. No family members with similar symptoms. -Prescribe Zyrtec for daytime use and Vistaril for nighttime use. -Prescribe antifungal shampoo for use on the body. -Advise patient to apply  over-the-counter antibiotic ointment to scratch marks to prevent infection. -Consider trial of topical steroid cream on one lesion to assess response. -If itching persists, refer to dermatology for further evaluation.  Gynecological issues Patient had recent gynecological treatment for heavy menstrual bleeding, which has now resolved. Patient completed a course of prescribed medication. -Advise patient to report any recurrence of previous symptoms.  Hyperlipidemia Patient is on medication for cholesterol control. -Advise patient to continue taking cholesterol medication daily.  Diabetes Patient is on medication for blood sugar control. -Advise patient to continue taking diabetes medication daily.  Abdominal pain Patient has medication for abdominal pain. -Advise patient to take pain medication as needed with meals.  Colonoscopy Patient needs colonoscopy, but preparation may be challenging due to need  for extensive laxative use. -Refer patient for colonoscopy and emphasize the importance of proper preparation to avoid complications and the need for repeat procedure.  Follow-up Plan to see patient in six weeks.      Return in about 6 weeks (around 04/29/2023) for chronic disease f/u, rash.     The patient was advised to call back or seek an in-person evaluation if the symptoms worsen or if the condition fails to improve as anticipated.  I discussed the assessment and treatment plan with the patient. The patient was provided an opportunity to ask questions and all were answered. The patient agreed with the plan and demonstrated an understanding of the instructions.  I, Debera Lat, PA-C have reviewed all documentation for this visit. The documentation on  03/18/23 for the exam, diagnosis, procedures, and orders are all accurate and complete.  Debera Lat, [facility], MMS [facility] [phone] (phone) [phone] (fax)  [facility]

## 2023-03-16 ENCOUNTER — Other Ambulatory Visit: Payer: Medicaid Other

## 2023-03-16 ENCOUNTER — Ambulatory Visit: Payer: Medicaid Other

## 2023-03-17 NOTE — Progress Notes (Signed)
Translator needed. "There was insufficient stool DNA detected to produce a valid Cologuard result. The patient will be contacted to initiate a new sample collection." We might need to proceed with colonoscopy instead of cologuard if pt agrees

## 2023-03-18 ENCOUNTER — Ambulatory Visit (INDEPENDENT_AMBULATORY_CARE_PROVIDER_SITE_OTHER): Payer: Medicaid Other | Admitting: Physician Assistant

## 2023-03-18 ENCOUNTER — Other Ambulatory Visit: Payer: Medicaid Other

## 2023-03-18 ENCOUNTER — Ambulatory Visit
Admission: RE | Admit: 2023-03-18 | Discharge: 2023-03-18 | Disposition: A | Discharge status: Home / Self Care | Payer: Medicaid Other | Source: Ambulatory Visit | Attending: Obstetrics and Gynecology | Admitting: Obstetrics and Gynecology

## 2023-03-18 ENCOUNTER — Encounter: Payer: Self-pay | Admitting: Physician Assistant

## 2023-03-18 VITALS — BP 131/69 | HR 60 | Ht 63.0 in | Wt 175.1 lb

## 2023-03-18 DIAGNOSIS — E0859 Diabetes mellitus due to underlying condition with other circulatory complications: Secondary | ICD-10-CM

## 2023-03-18 DIAGNOSIS — R030 Elevated blood-pressure reading, without diagnosis of hypertension: Secondary | ICD-10-CM

## 2023-03-18 DIAGNOSIS — R21 Rash and other nonspecific skin eruption: Secondary | ICD-10-CM | POA: Diagnosis not present

## 2023-03-18 DIAGNOSIS — Z1211 Encounter for screening for malignant neoplasm of colon: Secondary | ICD-10-CM | POA: Diagnosis not present

## 2023-03-18 DIAGNOSIS — N946 Dysmenorrhea, unspecified: Secondary | ICD-10-CM | POA: Diagnosis present

## 2023-03-18 DIAGNOSIS — N939 Abnormal uterine and vaginal bleeding, unspecified: Secondary | ICD-10-CM | POA: Insufficient documentation

## 2023-03-18 DIAGNOSIS — E785 Hyperlipidemia, unspecified: Secondary | ICD-10-CM | POA: Diagnosis not present

## 2023-03-18 DIAGNOSIS — R1030 Lower abdominal pain, unspecified: Secondary | ICD-10-CM

## 2023-03-18 MED ORDER — KETOCONAZOLE 2 % EX SHAM
1.0000 | MEDICATED_SHAMPOO | CUTANEOUS | 0 refills | Status: DC
Start: 2023-03-18 — End: 2023-05-04

## 2023-03-18 MED ORDER — HYDROXYZINE PAMOATE 25 MG PO CAPS
25.0000 mg | ORAL_CAPSULE | Freq: Three times a day (TID) | ORAL | 0 refills | Status: DC
Start: 2023-03-18 — End: 2024-01-12

## 2023-03-18 MED ORDER — CETIRIZINE HCL 10 MG PO TABS
10.0000 mg | ORAL_TABLET | Freq: Every day | ORAL | 2 refills | Status: DC
Start: 2023-03-18 — End: 2024-01-12

## 2023-03-30 ENCOUNTER — Encounter: Payer: Self-pay | Admitting: *Deleted

## 2023-03-31 ENCOUNTER — Other Ambulatory Visit: Payer: Self-pay | Admitting: Obstetrics and Gynecology

## 2023-03-31 DIAGNOSIS — N921 Excessive and frequent menstruation with irregular cycle: Secondary | ICD-10-CM

## 2023-04-01 ENCOUNTER — Ambulatory Visit
Admission: RE | Admit: 2023-04-01 | Discharge: 2023-04-01 | Disposition: A | Discharge status: Home / Self Care | Payer: Medicaid Other | Source: Ambulatory Visit | Attending: Student | Admitting: Student

## 2023-04-01 DIAGNOSIS — R519 Headache, unspecified: Secondary | ICD-10-CM

## 2023-04-02 IMAGING — CR DG CHEST 1V
1 series · 1 of 1 positions shown · non-contrast
Comparison: No priors.

CLINICAL DATA: 45-year-old female with history of positive T spot
tuberculosis test.

EXAM:
CHEST  1 VIEW

[chest pa]
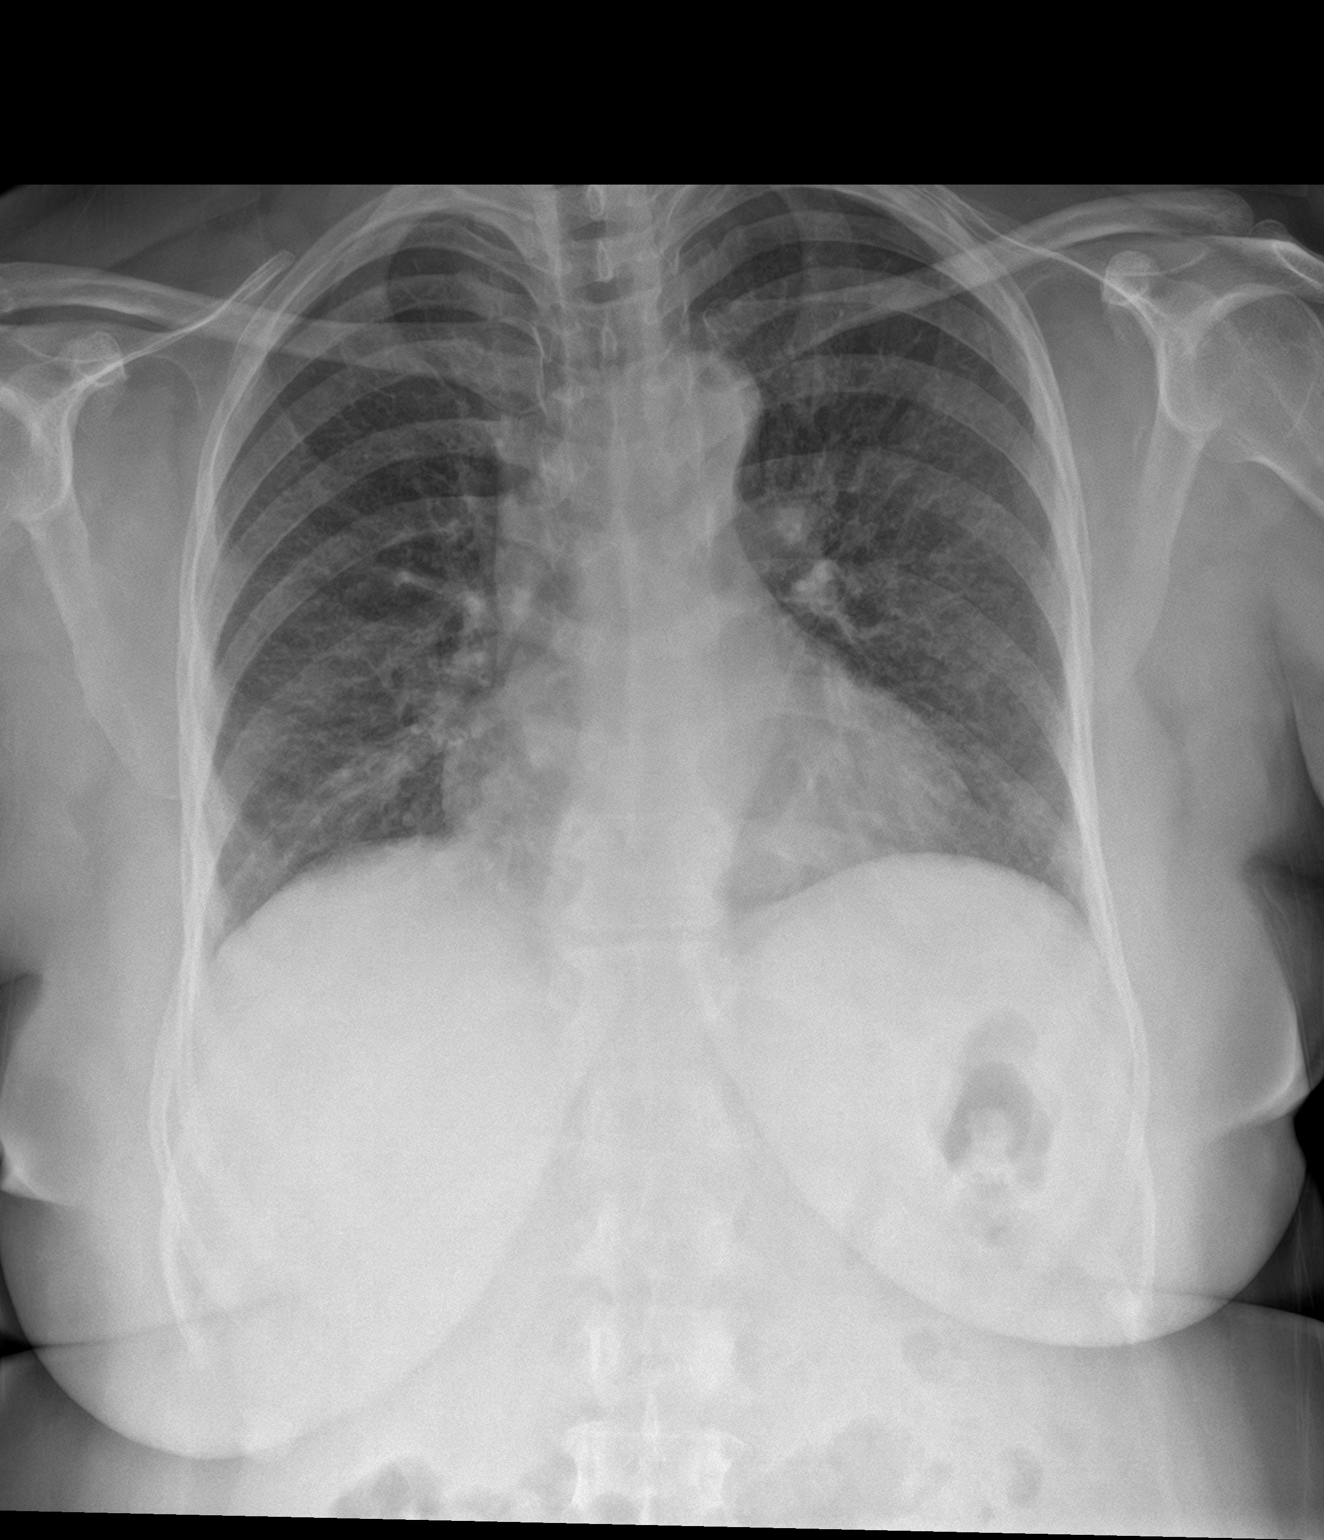

[1 of 1 positions shown; findings below may reference images not displayed]

FINDINGS: Lung volumes are normal. Mild diffuse peribronchial cuffing and
widespread interstitial prominence. No consolidative airspace
disease. No pleural effusions. No pneumothorax. No pulmonary nodule
or mass noted. Pulmonary vasculature and the cardiomediastinal
silhouette are within normal limits.
IMPRESSION: 1. No specific imaging findings to clearly indicate active pulmonary
tuberculosis.
2. There is however, diffuse peribronchial cuffing and widespread
areas of interstitial prominence which may suggest an acute
bronchitis. Further clinical evaluation is recommended.

## 2023-04-28 NOTE — Progress Notes (Signed)
Established patient visit  Patient: Kirsten Mccann   DOB: 1976-06-09   47 y.o. Female  MRN: [id number] Visit Date: 04/29/2023  Today's healthcare provider: Debera Lat, PA-C   Chief Complaint  Patient presents with   Medical Management of Chronic Issues   Subjective     Discussed the use of AI scribe software for clinical note transcription with the patient, who gave verbal consent to proceed.  History of Present Illness   The patient, a known case of hypertension and diabetes, presents with multiple complaints. The patient reports not checking her blood pressure at home, despite previous advice to do so. The patient also reports experiencing pruritus, which has improved with the use of an ointment. However, the patient has run out of the ointment. The patient also reports abdominal pain, which is not as severe as before but is still present. The pain is particularly bothersome when the patient switches sides while sleeping. The patient denies any problems with urination. The patient also reports heavy menstrual periods, but no further details are provided. The patient has been experiencing headaches, which may be related to sinus issues. The patient has been taking medication for these issues, including a medication for allergies and a nasal saline rinse or spray. The patient also reports having a lot of gas, which may be related to her diet.           09/15/2022   10:30 AM  Depression screen PHQ 2/9  Decreased Interest 3  Down, Depressed, Hopeless 2  PHQ - 2 Score 5  Altered sleeping 2  Tired, decreased energy 1  Change in appetite 0  Feeling bad or failure about yourself  2  Trouble concentrating 0  Moving slowly or fidgety/restless 1  Suicidal thoughts 0  PHQ-9 Score 11  Difficult doing work/chores Not difficult at all      09/15/2022   10:43 AM  GAD 7 : Generalized Anxiety Score  Nervous, Anxious, on Edge 3  Control/stop worrying 3  Worry too much - different things 3   Trouble relaxing 0  Restless 3  Easily annoyed or irritable 3  Afraid - awful might happen 3  Total GAD 7 Score 18  Anxiety Difficulty Somewhat difficult    Medications: Outpatient Medications Prior to Visit  Medication Sig   butalbital-apap-caffeine-codeine (FIORICET WITH CODEINE) 50-325-40-30 MG capsule TAKE ONE TAB FOR MIGRAINE HEADACHES ONLY AS NEEDED QD   cetirizine (ZYRTEC) 10 MG tablet Take 1 tablet (10 mg total) by mouth daily.   DULoxetine (CYMBALTA) 20 MG capsule Take 2 capsules (40 mg total) by mouth daily.   ferrous sulfate 325 (65 FE) MG tablet Take by mouth.   hydrOXYzine (VISTARIL) 25 MG capsule Take 1 capsule (25 mg total) by mouth 3 (three) times daily.   ibuprofen (ADVIL) 600 MG tablet Take 1 tablet (600 mg total) by mouth every 8 (eight) hours as needed for moderate pain.   ketoconazole (NIZORAL) 2 % shampoo Apply 1 Application topically 2 (two) times a week.   linaclotide (LINZESS) 72 MCG capsule Take 1 capsule (72 mcg total) by mouth daily before breakfast.   norethindrone (AYGESTIN) 5 MG tablet Take 1 tablet by mouth once daily   rosuvastatin (CRESTOR) 5 MG tablet Take 1 tablet (5 mg total) by mouth daily.   topiramate (TOPAMAX) 25 MG tablet Take one tab po qhs for headaches   topiramate (TOPAMAX) 50 MG tablet Take 1 tablet by mouth 2 (two) times daily.   [DISCONTINUED] famotidine (PEPCID) 20 MG  tablet Take one tab po qd for stomach at lunch   [DISCONTINUED] sitaGLIPtin (JANUVIA) 100 MG tablet Take 1 tablet by mouth once daily   No facility-administered medications prior to visit.    Review of Systems  All other systems reviewed and are negative.  Except see HPI       Objective    BP 129/77 (BP Location: Right Arm, Patient Position: Sitting, Cuff Size: Normal)   Pulse 67   Temp 98.2 F (36.8 C) (Oral)   Wt 182 lb (82.6 kg)   SpO2 100%   BMI 32.24 kg/m     Physical Exam Vitals reviewed.  Constitutional:      General: She is not in acute  distress.    Appearance: Normal appearance. She is well-developed. She is not diaphoretic.  HENT:     Head: Normocephalic and atraumatic.  Eyes:     General: No scleral icterus.    Conjunctiva/sclera: Conjunctivae normal.  Neck:     Thyroid: No thyromegaly.  Cardiovascular:     Rate and Rhythm: Normal rate and regular rhythm.     Pulses: Normal pulses.     Heart sounds: Normal heart sounds. No murmur heard. Pulmonary:     Effort: Pulmonary effort is normal. No respiratory distress.     Breath sounds: Normal breath sounds. No wheezing, rhonchi or rales.  Musculoskeletal:     Cervical back: Neck supple.     Right lower leg: No edema.     Left lower leg: No edema.  Lymphadenopathy:     Cervical: No cervical adenopathy.  Skin:    General: Skin is warm and dry.     Findings: No rash.  Neurological:     Mental Status: She is alert and oriented to person, place, and time. Mental status is at baseline.  Psychiatric:        Mood and Affect: Mood normal.        Behavior: Behavior normal.      No results found for any visits on 04/29/23.  Assessment & Plan    Hypertension Chronic and stable Patient has not been monitoring blood pressure at home as previously advised. Current blood pressure in office is within normal range. -Attempt to order a blood pressure cuff for the patient through insurance. -Advise patient to monitor blood pressure periodically at home once cuff is obtained. She is not taking any meds - Lipid panel - Comprehensive metabolic panel - Hemoglobin A1c - CBC with Differential/Platelet Labs should be done a week prior to the next visit  Hyperlipidemia associated with type 2 diabetes mellitus (HCC) Chronic and unstable Continue taking crestor 5mg  Continue low cholesterol diet and daily exercise Will FOLLOW-UP   Obesity (BMI 30.0-34.9) Chronic and associated with HLD, DM, HTN - Lipid panel - Comprehensive metabolic panel - Hemoglobin A1c - CBC with  Differential/Platelet Will reassess after  receiving lab results  Lower abdominal pain Chronic Patient reports ongoing right-sided flank pain with generalized abdominal pain, though not as severe as before. Pain is associated with eating certain foods and increased gas. -Advise patient to adjust diet, including increased yogurt intake. -Plan to repeat previous tests to confirm if previous findings of bacterial vaginosis and yeast infection have resolved, as these could be contributing to abdominal pain. 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 - sitaGLIPtin (JANUVIA) 100 MG tablet; Take 1 tablet (100 mg total) by mouth daily.  Dispense: 90 tablet; Refill: 3 - Lipid panel - Comprehensive metabolic panel - Hemoglobin A1c - CBC with Differential/Platelet  Menorrhagia No current complaints. Patient has been managing with OBGYN. -No changes to current management plan.  Colon Cancer Screening Patient has not completed colonoscopy. -Provide patient with Cologuard kit and instructions for use. Encourage patient to complete and return kit. This will be 3 attempt.  Sinusitis Patient has been experiencing headaches. Previous imaging confirmed sinusitis. -Advise patient to take over-the-counter allergy medication such as Allegra, Claritin, or Zyrtec. -Advise patient to use nasal saline rinse or spray and Flonase. -Provide patient with samples of  Zyrtec.  Follow-up Plan to review lab results and reassess current management plan at next visit in January. Bring all medication due to possible non compliance  Rash Pruritus Improvement noted with current treatment.  -Refill ointment as needed for continued management of itching. However, she needs to be reassessed before the next refill and set up a separate appointment for this problems. Continue with Zyrtec during the day , hydroxyzine during the night  and famotidine for pruritis. Will revisit at her next appointment  Return in about 2 months (around 06/29/2023) for chronic disease f/u, advised to do blood work a week prior.    The patient was advised to call back or seek an in-person evaluation if the symptoms worsen or if the condition fails to improve as anticipated.  I discussed the assessment and treatment plan with the patient. The patient was provided an opportunity to ask questions and all were answered. The patient agreed with the plan and demonstrated an understanding of the instructions.  I, Debera Lat, PA-C have reviewed all documentation for this visit. The documentation on  04/29/23  for the exam, diagnosis, procedures, and orders are all accurate and complete.  Debera Lat, [facility], MMS [facility] [phone] (phone) [phone] (fax)   [facility]

## 2023-04-29 ENCOUNTER — Other Ambulatory Visit (HOSPITAL_COMMUNITY)
Admission: RE | Admit: 2023-04-29 | Discharge: 2023-04-29 | Disposition: A | Discharge status: Home / Self Care | Payer: Medicaid Other | Source: Ambulatory Visit | Attending: Physician Assistant | Admitting: Physician Assistant

## 2023-04-29 ENCOUNTER — Ambulatory Visit: Payer: Medicaid Other | Admitting: Physician Assistant

## 2023-04-29 ENCOUNTER — Encounter: Payer: Self-pay | Admitting: Physician Assistant

## 2023-04-29 VITALS — BP 129/77 | HR 67 | Temp 98.2°F | Wt 182.0 lb

## 2023-04-29 DIAGNOSIS — Z7984 Long term (current) use of oral hypoglycemic drugs: Secondary | ICD-10-CM

## 2023-04-29 DIAGNOSIS — I152 Hypertension secondary to endocrine disorders: Secondary | ICD-10-CM | POA: Diagnosis not present

## 2023-04-29 DIAGNOSIS — R1030 Lower abdominal pain, unspecified: Secondary | ICD-10-CM | POA: Diagnosis present

## 2023-04-29 DIAGNOSIS — E1169 Type 2 diabetes mellitus with other specified complication: Secondary | ICD-10-CM

## 2023-04-29 DIAGNOSIS — E785 Hyperlipidemia, unspecified: Secondary | ICD-10-CM

## 2023-04-29 DIAGNOSIS — F4541 Pain disorder exclusively related to psychological factors: Secondary | ICD-10-CM

## 2023-04-29 DIAGNOSIS — E66811 Obesity, class 1: Secondary | ICD-10-CM | POA: Diagnosis not present

## 2023-04-29 DIAGNOSIS — E1159 Type 2 diabetes mellitus with other circulatory complications: Secondary | ICD-10-CM

## 2023-04-29 DIAGNOSIS — N898 Other specified noninflammatory disorders of vagina: Secondary | ICD-10-CM | POA: Insufficient documentation

## 2023-04-29 DIAGNOSIS — R21 Rash and other nonspecific skin eruption: Secondary | ICD-10-CM

## 2023-04-29 DIAGNOSIS — E0859 Diabetes mellitus due to underlying condition with other circulatory complications: Secondary | ICD-10-CM

## 2023-04-29 MED ORDER — FAMOTIDINE 20 MG PO TABS: ORAL_TABLET | ORAL | 3 refills | Status: DC

## 2023-04-29 MED ORDER — SITAGLIPTIN PHOSPHATE 100 MG PO TABS: 100.0000 mg | ORAL_TABLET | Freq: Every day | ORAL | 3 refills | Status: DC

## 2023-04-30 ENCOUNTER — Encounter: Payer: Self-pay | Admitting: Physician Assistant

## 2023-04-30 DIAGNOSIS — I152 Hypertension secondary to endocrine disorders: Secondary | ICD-10-CM | POA: Insufficient documentation

## 2023-04-30 MED ORDER — BLOOD PRESSURE KIT DEVI: 0 refills | Status: DC

## 2023-05-03 LAB — CERVICOVAGINAL ANCILLARY ONLY
Bacterial Vaginitis (gardnerella): NEGATIVE
Candida Glabrata: NEGATIVE
Candida Vaginitis: NEGATIVE
Chlamydia: NEGATIVE
Comment: NEGATIVE
Comment: NEGATIVE
Comment: NEGATIVE
Comment: NEGATIVE
Comment: NEGATIVE
Comment: NORMAL
Neisseria Gonorrhea: NEGATIVE
Trichomonas: NEGATIVE

## 2023-05-04 ENCOUNTER — Ambulatory Visit: Payer: Medicaid Other | Admitting: Dermatology

## 2023-05-04 ENCOUNTER — Encounter: Payer: Self-pay | Admitting: Dermatology

## 2023-05-04 DIAGNOSIS — L209 Atopic dermatitis, unspecified: Secondary | ICD-10-CM | POA: Diagnosis not present

## 2023-05-04 DIAGNOSIS — R21 Rash and other nonspecific skin eruption: Secondary | ICD-10-CM | POA: Diagnosis not present

## 2023-05-04 MED ORDER — TRIAMCINOLONE ACETONIDE 0.1 % EX OINT: TOPICAL_OINTMENT | CUTANEOUS | 11 refills | Status: DC

## 2023-05-04 MED ORDER — KETOCONAZOLE 2 % EX SHAM: 1.0000 | MEDICATED_SHAMPOO | CUTANEOUS | 11 refills | Status: DC

## 2023-05-04 NOTE — Patient Instructions (Addendum)
Treatment Plan: Start triamcinolone 0.1% ointment twice daily to affected areas until smooth. Avoid applying to face, groin, and axilla. Use as directed. Long-term use can cause thinning of the skin. Continue ketoconazole 2% shampoo twice weekly and leave on for 5 minutes before rinsing off.  Topical steroids (such as triamcinolone, fluocinolone, fluocinonide, mometasone, clobetasol, halobetasol, betamethasone, hydrocortisone) can cause thinning and lightening of the skin if they are used for too long in the same area. Your physician has selected the right strength medicine for your problem and area affected on the body. Please use your medication only as directed by your physician to prevent side effects.   Due to recent changes in healthcare laws, you may see results of your pathology and/or laboratory studies on MyChart before the doctors have had a chance to review them. We understand that in some cases there may be results that are confusing or concerning to you. Please understand that not all results are received at the same time and often the doctors may need to interpret multiple results in order to provide you with the best plan of care or course of treatment. Therefore, we ask that you please give [place] 2 business days to thoroughly review all your results before contacting the office for clarification. Should we see a critical lab result, you will be contacted sooner.   If You Need Anything After Your Visit  If you have any questions or concerns for your doctor, please call our main line at [phone] and press option 4 to reach your doctor's medical assistant. If no one answers, please leave a voicemail as directed and we will return your call as soon as possible. Messages left after 4 pm will be answered the following business day.   You may also send [place] a message via MyChart. We typically respond to MyChart messages within 1-2 business days.  For prescription refills, please ask your  pharmacy to contact our office. Our fax number is [phone].  If you have an urgent issue when the clinic is closed that cannot wait until the next business day, you can page your doctor at the number below.    Please note that while we do our best to be available for urgent issues outside of office hours, we are not available 24/7.   If you have an urgent issue and are unable to reach [place], you may choose to seek medical care at your doctor's office, retail clinic, urgent care center, or emergency room.  If you have a medical emergency, please immediately call 911 or go to the emergency department.  Pager Numbers  - Dr. Gwen Pounds: [phone]  - Dr. Roseanne Reno: [phone]  - Dr. Katrinka Blazing: [phone]   In the event of inclement weather, please call our main line at [phone] for an update on the status of any delays or closures.  Dermatology Medication Tips: Please keep the boxes that topical medications come in in order to help keep track of the instructions about where and how to use these. Pharmacies typically print the medication instructions only on the boxes and not directly on the medication tubes.   If your medication is too expensive, please contact our office at [phone] option 4 or send [place] a message through MyChart.   We are unable to tell what your co-pay for medications will be in advance as this is different depending on your insurance coverage. However, we may be able to find a substitute medication at lower cost or fill out paperwork to get insurance  to cover a needed medication.   If a prior authorization is required to get your medication covered by your insurance company, please allow [place] 1-2 business days to complete this process.  Drug prices often vary depending on where the prescription is filled and some pharmacies may offer cheaper prices.  The website www.goodrx.com contains coupons for medications through different pharmacies. The prices here do not  account for what the cost may be with help from insurance (it may be cheaper with your insurance), but the website can give you the price if you did not use any insurance.  - You can print the associated coupon and take it with your prescription to the pharmacy.  - You may also stop by our office during regular business hours and pick up a GoodRx coupon card.  - If you need your prescription sent electronically to a different pharmacy, notify our office through [facility] or by phone at [phone] option 4.

## 2023-05-04 NOTE — Progress Notes (Signed)
   New Patient Visit   Subjective  Kirsten Mccann is a 47 y.o. female who presents for the following: Rash  At arms that was burning all the time. PCP gave patient ketoconazole 2% shampoo to use once a week and it has helped, minimal burning now. She does scratch at areas and sometimes bleeds from scratching.   The following portions of the chart were reviewed this encounter and updated as appropriate: medications, allergies, medical history  Review of Systems:  No other skin or systemic complaints except as noted in HPI or Assessment and Plan.  Objective  Well appearing patient in no apparent distress; mood and affect are within normal limits.  A focused examination was performed of the following areas: Arms, legs  Relevant exam findings are noted in the Assessment and Plan.    Assessment & Plan   Atopic dermatitis, unspecified type  Rash  Related Medications hydrOXYzine (VISTARIL) 25 MG capsule Take 1 capsule (25 mg total) by mouth 3 (three) times daily.  cetirizine (ZYRTEC) 10 MG tablet Take 1 tablet (10 mg total) by mouth daily.  ketoconazole (NIZORAL) 2 % shampoo Apply 1 Application topically 2 (two) times a week. Leave on for 5 minutes before rinsing off.    RASH, suspect eczema Exam: few scattered pink papules on left arm and lower back   Treatment Plan: Start TMC 0.1% ointment twice daily to affected areas until smooth. Avoid applying to face, groin, and axilla. Use as directed. Long-term use can cause thinning of the skin. Continue ketoconazole 2% shampoo twice weekly and leave on for 5 minutes before rinsing off.  Topical steroids (such as triamcinolone, fluocinolone, fluocinonide, mometasone, clobetasol, halobetasol, betamethasone, hydrocortisone) can cause thinning and lightening of the skin if they are used for too long in the same area. Your physician has selected the right strength medicine for your problem and area affected on the body. Please use your  medication only as directed by your physician to prevent side effects.     Return in about 3 months (around 08/04/2023) for Rash.  Anise Salvo, RMA, am acting as scribe for Elie Goody, MD .   Documentation: I have reviewed the above documentation for accuracy and completeness, and I agree with the above.  Elie Goody, MD

## 2023-05-07 ENCOUNTER — Ambulatory Visit: Payer: Medicaid Other | Admitting: Physician Assistant

## 2023-06-03 ENCOUNTER — Ambulatory Visit: Payer: Medicaid Other | Admitting: Physician Assistant

## 2023-06-03 NOTE — Progress Notes (Deleted)
Established patient visit  Patient: Kirsten Mccann   DOB: 03-17-1976   47 y.o. Female  MRN: [id number] Visit Date: 06/03/2023  Today's healthcare provider: Debera Lat, PA-C   No chief complaint on file.  Subjective       Discussed the use of AI scribe software for clinical note transcription with the patient, who gave verbal consent to proceed.  History of Present Illness               09/15/2022   10:30 AM  Depression screen PHQ 2/9  Decreased Interest 3  Down, Depressed, Hopeless 2  PHQ - 2 Score 5  Altered sleeping 2  Tired, decreased energy 1  Change in appetite 0  Feeling bad or failure about yourself  2  Trouble concentrating 0  Moving slowly or fidgety/restless 1  Suicidal thoughts 0  PHQ-9 Score 11  Difficult doing work/chores Not difficult at all      09/15/2022   10:43 AM  GAD 7 : Generalized Anxiety Score  Nervous, Anxious, on Edge 3  Control/stop worrying 3  Worry too much - different things 3  Trouble relaxing 0  Restless 3  Easily annoyed or irritable 3  Afraid - awful might happen 3  Total GAD 7 Score 18  Anxiety Difficulty Somewhat difficult    Medications: Outpatient Medications Prior to Visit  Medication Sig   Blood Pressure Monitoring (BLOOD PRESSURE KIT) DEVI Use to measure BP daily   butalbital-apap-caffeine-codeine (FIORICET WITH CODEINE) 50-325-40-30 MG capsule TAKE ONE TAB FOR MIGRAINE HEADACHES ONLY AS NEEDED QD   cetirizine (ZYRTEC) 10 MG tablet Take 1 tablet (10 mg total) by mouth daily.   DULoxetine (CYMBALTA) 20 MG capsule Take 2 capsules (40 mg total) by mouth daily.   famotidine (PEPCID) 20 MG tablet Take one tab po qd for stomach at lunch   ferrous sulfate 325 (65 FE) MG tablet Take by mouth.   hydrOXYzine (VISTARIL) 25 MG capsule Take 1 capsule (25 mg total) by mouth 3 (three) times daily.   ibuprofen (ADVIL) 600 MG tablet Take 1 tablet (600 mg total) by mouth every 8 (eight) hours as needed for moderate pain.    ketoconazole (NIZORAL) 2 % shampoo Apply 1 Application topically 2 (two) times a week. Leave on for 5 minutes before rinsing off.   linaclotide (LINZESS) 72 MCG capsule Take 1 capsule (72 mcg total) by mouth daily before breakfast.   norethindrone (AYGESTIN) 5 MG tablet Take 1 tablet by mouth once daily   rosuvastatin (CRESTOR) 5 MG tablet Take 1 tablet (5 mg total) by mouth daily.   sitaGLIPtin (JANUVIA) 100 MG tablet Take 1 tablet (100 mg total) by mouth daily.   topiramate (TOPAMAX) 25 MG tablet Take one tab po qhs for headaches   topiramate (TOPAMAX) 50 MG tablet Take 1 tablet by mouth 2 (two) times daily.   triamcinolone ointment (KENALOG) 0.1 % Apply twice daily to affected areas until smooth. Avoid applying to face, groin, and axilla. Use as directed. Long-term use can cause thinning of the skin.   No facility-administered medications prior to visit.    Review of Systems  All other systems reviewed and are negative.  All negative Except see HPI   {Insert previous labs (optional):23779} {See past labs  Heme  Chem  Endocrine  Serology  Results Review (optional):1}   Objective    There were no vitals taken for this visit. {Insert last BP/Wt (optional):23777}{See vitals history (optional):1}   Physical Exam Vitals reviewed.  Constitutional:      General: She is not in acute distress.    Appearance: Normal appearance. She is well-developed. She is not diaphoretic.  HENT:     Head: Normocephalic and atraumatic.  Eyes:     General: No scleral icterus.    Conjunctiva/sclera: Conjunctivae normal.  Neck:     Thyroid: No thyromegaly.  Cardiovascular:     Rate and Rhythm: Normal rate and regular rhythm.     Pulses: Normal pulses.     Heart sounds: Normal heart sounds. No murmur heard. Pulmonary:     Effort: Pulmonary effort is normal. No respiratory distress.     Breath sounds: Normal breath sounds. No wheezing, rhonchi or rales.  Musculoskeletal:     Cervical back: Neck  supple.     Right lower leg: No edema.     Left lower leg: No edema.  Lymphadenopathy:     Cervical: No cervical adenopathy.  Skin:    General: Skin is warm and dry.     Findings: No rash.  Neurological:     Mental Status: She is alert and oriented to person, place, and time. Mental status is at baseline.  Psychiatric:        Mood and Affect: Mood normal.        Behavior: Behavior normal.      No results found for any visits on 06/03/23.      Assessment and Plan      No orders of the defined types were placed in this encounter.   No follow-ups on file.   The patient was advised to call back or seek an in-person evaluation if the symptoms worsen or if the condition fails to improve as anticipated.  I discussed the assessment and treatment plan with the patient. The patient was provided an opportunity to ask questions and all were answered. The patient agreed with the plan and demonstrated an understanding of the instructions.  I, Debera Lat, PA-C have reviewed all documentation for this visit. The documentation on 06/03/2023  for the exam, diagnosis, procedures, and orders are all accurate and complete.  Debera Lat, [facility], MMS [facility] [phone] (phone) [phone] (fax)  [facility]

## 2023-06-29 ENCOUNTER — Ambulatory Visit: Payer: Medicaid Other | Admitting: Physician Assistant

## 2023-06-30 LAB — COMPREHENSIVE METABOLIC PANEL
ALT: 11 [IU]/L (ref 0–32)
AST: 16 [IU]/L (ref 0–40)
Albumin: 4.1 g/dL (ref 3.9–4.9)
Alkaline Phosphatase: 56 [IU]/L (ref 44–121)
BUN/Creatinine Ratio: 15 (ref 9–23)
BUN: 10 mg/dL (ref 6–24)
Bilirubin Total: 0.3 mg/dL (ref 0.0–1.2)
CO2: 18 mmol/L — ABNORMAL LOW (ref 20–29)
Calcium: 8.5 mg/dL — ABNORMAL LOW (ref 8.7–10.2)
Chloride: 106 mmol/L (ref 96–106)
Creatinine, Ser: 0.66 mg/dL (ref 0.57–1.00)
Globulin, Total: 2.8 g/dL (ref 1.5–4.5)
Glucose: 197 mg/dL — ABNORMAL HIGH (ref 70–99)
Potassium: 3.9 mmol/L (ref 3.5–5.2)
Sodium: 139 mmol/L (ref 134–144)
Total Protein: 6.9 g/dL (ref 6.0–8.5)
eGFR: 110 mL/min/{1.73_m2} (ref >59)

## 2023-06-30 LAB — CBC WITH DIFFERENTIAL/PLATELET
Basophils Absolute: 0.1 10*3/uL (ref 0.0–0.2)
Basos: 1 %
EOS (ABSOLUTE): 0.1 10*3/uL (ref 0.0–0.4)
Eos: 1 %
Hematocrit: 39.4 % (ref 34.0–46.6)
Hemoglobin: 13 g/dL (ref 11.1–15.9)
Immature Grans (Abs): 0 10*3/uL (ref 0.0–0.1)
Immature Granulocytes: 0 %
Lymphocytes Absolute: 2.1 10*3/uL (ref 0.7–3.1)
Lymphs: 42 %
MCH: 28.5 pg (ref 26.6–33.0)
MCHC: 33 g/dL (ref 31.5–35.7)
MCV: 86 fL (ref 79–97)
Monocytes Absolute: 0.3 10*3/uL (ref 0.1–0.9)
Monocytes: 5 %
Neutrophils Absolute: 2.5 10*3/uL (ref 1.4–7.0)
Neutrophils: 51 %
Platelets: 257 10*3/uL (ref 150–450)
RBC: 4.56 x10E6/uL (ref 3.77–5.28)
RDW: 12.4 % (ref 11.7–15.4)
WBC: 5 10*3/uL (ref 3.4–10.8)

## 2023-06-30 LAB — LIPID PANEL
Chol/HDL Ratio: 7.8 {ratio} — ABNORMAL HIGH (ref 0.0–4.4)
Cholesterol, Total: 219 mg/dL — ABNORMAL HIGH (ref 100–199)
HDL: 28 mg/dL — ABNORMAL LOW (ref >39)
LDL Chol Calc (NIH): 127 mg/dL — ABNORMAL HIGH (ref 0–99)
Triglycerides: 359 mg/dL — ABNORMAL HIGH (ref 0–149)
VLDL Cholesterol Cal: 64 mg/dL — ABNORMAL HIGH (ref 5–40)

## 2023-06-30 LAB — HEMOGLOBIN A1C
Est. average glucose Bld gHb Est-mCnc: 189 mg/dL
Hgb A1c MFr Bld: 8.2 % — ABNORMAL HIGH (ref 4.8–5.6)

## 2023-07-02 MED ORDER — ROSUVASTATIN CALCIUM 10 MG PO TABS: 10.0000 mg | ORAL_TABLET | Freq: Every day | ORAL | 3 refills | Status: DC

## 2023-07-02 NOTE — Addendum Note (Signed)
Addended by: Debera Lat on: 07/02/2023 12:44 PM   Modules accepted: Orders

## 2023-07-02 NOTE — Progress Notes (Signed)
Interpreter : Pasht[place] Please, let pt know that she has elevated cholesterol. Advised low cholesterol diet and increase her current dose of rosuvastatin to 10mg . Also, her A1c elevated. We will discuss the need for a second DM medication during her appointment if she is still taking her current medication every day. - it might be difficult to explain to her due to comprehension level At least please, Let her know that  !She needs to bring all her current medication with her to the appointment with me.

## 2023-07-11 NOTE — Progress Notes (Deleted)
 Established patient visit  Patient: Kirsten Mccann   DOB: [birth year]   47 y.o. Female  MRN: [id number] Visit Date: 07/12/2023  Today's healthcare provider: Debera Lat, PA-C   No chief complaint on file.  Subjective       Discussed the use of AI scribe software for clinical note transcription with the patient, who gave verbal consent to proceed.  History of Present Illness        Hypertension Chronic and stable Patient has not been monitoring blood pressure at home as previously advised. Current blood pressure in office is within normal range. -Attempt to order a blood pressure cuff for the patient through insurance. -Advise patient to monitor blood pressure periodically at home once cuff is obtained. She is not taking any meds   Hyperlipidemia associated with type 2 diabetes mellitus (HCC) Continue taking crestor 5mg  Obesity (BMI 30.0-34.9) Chronic and associated with HLD, DM, HTN   Lower abdominal pain Chronic Patient reports ongoing right-sided flank pain with generalized abdominal pain, though not as severe as before. Pain is associated with eating certain foods and increased gas. -Advise patient to adjust diet, including increased yogurt intake. -Plan to repeat previous tests to confirm if previous findings of bacterial vaginosis and yeast infection have resolved, as these could be contributing to abdominal pain. 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 - sitaGLIPtin (JANUVIA) 100 MG tablet; Take 1 tablet (100 mg total) by mouth daily.  Dispense: 90 tablet; Refill: 3   Menorrhagia No current complaints. Patient has been managing with OBGYN. -No changes to current management plan.   Colon Cancer Screening Patient has not completed colonoscopy. -Provide patient with Cologuard kit and instructions for use. Encourage patient to complete and return kit. This will be 3 attempt.   Sinusitis Patient has been experiencing headaches. Previous imaging confirmed sinusitis. -Advise patient to take over-the-counter allergy medication such as Allegra, Claritin, or Zyrtec. -Advise patient to use nasal saline rinse or spray and Flonase. -Provide patient with samples of Zyrtec.   Rash Pruritus Improvement noted with current treatment.  -Refill ointment as needed for continued management of itching. However, she needs to be reassessed before the next refill and set up a separate appointment for this problems. Continue with Zyrtec during the day , hydroxyzine during the night  and famotidine for pruritis. Will revisit at her next appointment   Saw dermatology in 11/24  Vaccines and eye exam, colonoscopy, annual in 6 weeks Please, let pt know that she has elevated cholesterol. Advised low cholesterol diet and increase her current dose of rosuvastatin to 10mg . Also, her A1c elevated. We will discuss the need for a second DM medication during her appointment if she is still taking her current medication every day. - it might be difficult to explain to her due to comprehension level At least please, Let her know that !She needs to bring all her current medication with her to the appointment with me.    09/15/2022   10:30 AM  Depression screen  PHQ 2/9  Decreased Interest 3  Down, Depressed, Hopeless 2  PHQ - 2 Score 5  Altered sleeping 2  Tired, decreased energy 1  Change in appetite 0  Feeling bad or failure about yourself  2   Trouble concentrating 0  Moving slowly or fidgety/restless 1  Suicidal thoughts 0  PHQ-9 Score 11  Difficult doing work/chores Not difficult at all      09/15/2022   10:43 AM  GAD 7 : Generalized Anxiety Score  Nervous, Anxious, on Edge 3  Control/stop worrying 3  Worry too much - different things 3  Trouble relaxing 0  Restless 3  Easily annoyed or irritable 3  Afraid - awful might happen 3  Total GAD 7 Score 18  Anxiety Difficulty Somewhat difficult    Medications: Outpatient Medications Prior to Visit  Medication Sig   Blood Pressure Monitoring (BLOOD PRESSURE KIT) DEVI Use to measure BP daily   butalbital-apap-caffeine-codeine (FIORICET WITH CODEINE) 50-325-40-30 MG capsule TAKE ONE TAB FOR MIGRAINE HEADACHES ONLY AS NEEDED QD   cetirizine (ZYRTEC) 10 MG tablet Take 1 tablet (10 mg total) by mouth daily.   DULoxetine (CYMBALTA) 20 MG capsule Take 2 capsules (40 mg total) by mouth daily.   famotidine (PEPCID) 20 MG tablet Take one tab po qd for stomach at lunch   ferrous sulfate 325 (65 FE) MG tablet Take by mouth.   hydrOXYzine (VISTARIL) 25 MG capsule Take 1 capsule (25 mg total) by mouth 3 (three) times daily.   ibuprofen (ADVIL) 600 MG tablet Take 1 tablet (600 mg total) by mouth every 8 (eight) hours as needed for moderate pain.   ketoconazole (NIZORAL) 2 % shampoo Apply 1 Application topically 2 (two) times a week. Leave on for 5 minutes before rinsing off.   linaclotide (LINZESS) 72 MCG capsule Take 1 capsule (72 mcg total) by mouth daily before breakfast.   norethindrone (AYGESTIN) 5 MG tablet Take 1 tablet by mouth once daily   rosuvastatin (CRESTOR) 10 MG tablet Take 1 tablet (10 mg total) by mouth daily.   rosuvastatin (CRESTOR) 5 MG tablet Take 1 tablet (5 mg total) by mouth daily.   sitaGLIPtin (JANUVIA) 100 MG tablet Take 1 tablet (100 mg total) by mouth daily.   topiramate (TOPAMAX) 25 MG tablet Take one tab po qhs for headaches   topiramate (TOPAMAX) 50 MG  tablet Take 1 tablet by mouth 2 (two) times daily.   triamcinolone ointment (KENALOG) 0.1 % Apply twice daily to affected areas until smooth. Avoid applying to face, groin, and axilla. Use as directed. Long-term use can cause thinning of the skin.   No facility-administered medications prior to visit.    Review of Systems All negative Except see HPI   {Insert previous labs (optional):23779} {See past labs  Heme  Chem  Endocrine  Serology  Results Review (optional):1}   Objective    There were no vitals taken for this visit. {Insert last BP/Wt (optional):23777}{See vitals history (optional):1}   Physical Exam   No results found for any visits on 07/12/23.      Assessment and Plan             No orders of the defined types were placed in this encounter.   No follow-ups on file.   The patient was advised to call back or seek an in-person evaluation if the symptoms worsen or if the condition fails to improve as anticipated.  I discussed the assessment and treatment plan with the patient. The  patient was provided an opportunity to ask questions and all were answered. The patient agreed with the plan and demonstrated an understanding of the instructions.  I, Debera Lat, PA-C have reviewed all documentation for this visit. The documentation on 07/12/2023  for the exam, diagnosis, procedures, and orders are all accurate and complete.  Debera Lat, [facility], MMS [facility] [phone] (phone) [phone] (fax)  [facility]

## 2023-07-12 ENCOUNTER — Ambulatory Visit: Payer: Medicaid Other | Admitting: Physician Assistant

## 2023-07-12 DIAGNOSIS — R1030 Lower abdominal pain, unspecified: Secondary | ICD-10-CM

## 2023-07-12 DIAGNOSIS — E1169 Type 2 diabetes mellitus with other specified complication: Secondary | ICD-10-CM

## 2023-07-12 DIAGNOSIS — J329 Chronic sinusitis, unspecified: Secondary | ICD-10-CM

## 2023-07-12 DIAGNOSIS — E1159 Type 2 diabetes mellitus with other circulatory complications: Secondary | ICD-10-CM

## 2023-07-12 DIAGNOSIS — Z1211 Encounter for screening for malignant neoplasm of colon: Secondary | ICD-10-CM

## 2023-07-12 DIAGNOSIS — E66811 Obesity, class 1: Secondary | ICD-10-CM

## 2023-07-12 DIAGNOSIS — R21 Rash and other nonspecific skin eruption: Secondary | ICD-10-CM

## 2023-07-12 DIAGNOSIS — E0859 Diabetes mellitus due to underlying condition with other circulatory complications: Secondary | ICD-10-CM

## 2023-07-12 DIAGNOSIS — N898 Other specified noninflammatory disorders of vagina: Secondary | ICD-10-CM

## 2023-07-12 DIAGNOSIS — N921 Excessive and frequent menstruation with irregular cycle: Secondary | ICD-10-CM

## 2023-07-16 ENCOUNTER — Ambulatory Visit (INDEPENDENT_AMBULATORY_CARE_PROVIDER_SITE_OTHER): Payer: Medicaid Other | Admitting: Physician Assistant

## 2023-07-16 ENCOUNTER — Encounter: Payer: Self-pay | Admitting: Physician Assistant

## 2023-07-16 VITALS — BP 136/80 | HR 64 | Ht 63.0 in | Wt 179.0 lb

## 2023-07-16 DIAGNOSIS — Z559 Problems related to education and literacy, unspecified: Secondary | ICD-10-CM | POA: Insufficient documentation

## 2023-07-16 DIAGNOSIS — E66811 Obesity, class 1: Secondary | ICD-10-CM | POA: Diagnosis not present

## 2023-07-16 DIAGNOSIS — E1169 Type 2 diabetes mellitus with other specified complication: Secondary | ICD-10-CM | POA: Diagnosis not present

## 2023-07-16 DIAGNOSIS — Z603 Acculturation difficulty: Secondary | ICD-10-CM | POA: Insufficient documentation

## 2023-07-16 DIAGNOSIS — E785 Hyperlipidemia, unspecified: Secondary | ICD-10-CM

## 2023-07-16 DIAGNOSIS — E1159 Type 2 diabetes mellitus with other circulatory complications: Secondary | ICD-10-CM | POA: Diagnosis not present

## 2023-07-16 DIAGNOSIS — Z758 Other problems related to medical facilities and other health care: Secondary | ICD-10-CM

## 2023-07-16 DIAGNOSIS — I152 Hypertension secondary to endocrine disorders: Secondary | ICD-10-CM

## 2023-07-16 DIAGNOSIS — R1030 Lower abdominal pain, unspecified: Secondary | ICD-10-CM | POA: Diagnosis not present

## 2023-07-16 DIAGNOSIS — E0859 Diabetes mellitus due to underlying condition with other circulatory complications: Secondary | ICD-10-CM

## 2023-07-16 MED ORDER — GLIPIZIDE 5 MG PO TABS: 5.0000 mg | ORAL_TABLET | Freq: Every day | ORAL | 3 refills | Status: DC

## 2023-07-16 MED ORDER — OMEPRAZOLE 20 MG PO CPDR: 20.0000 mg | DELAYED_RELEASE_CAPSULE | Freq: Every day | ORAL | 3 refills | Status: DC

## 2023-07-16 MED ORDER — GLIPIZIDE 5 MG PO TABS: 5.0000 mg | ORAL_TABLET | Freq: Two times a day (BID) | ORAL | 3 refills | Status: DC

## 2023-07-16 NOTE — Progress Notes (Signed)
Established patient visit  Patient: Kirsten Mccann   DOB: 13-Nov-1975   47 y.o. Female  MRN: [id number] Visit Date: 07/16/2023  Today's healthcare provider: Debera Lat, PA-C   Chief Complaint  Patient presents with   Follow-up    Follow-up -pt stated still having abdominal pain w/ medication.   Subjective      Abdur #3 [id number] Discussed the use of AI scribe software for clinical note transcription with the patient, who gave verbal consent to proceed.  History of Present Illness   The patient, with a history of high cholesterol and blood sugar, presents with two main concerns. The first is a persistent abdominal pain, which the patient reports is associated with both food intake and menstruation. The pain is located in the lower portion of the abdomen. The patient notes some improvement in the pain after taking medication, but the medication has since run out. The second concern is spotting, which is occurring despite the patient not experiencing heavy bleeding as before. The patient also mentions that she has not been checking her blood sugar levels recently. The patient's son is involved in managing the patient's appointments due to language barriers.           09/15/2022   10:30 AM  Depression screen PHQ 2/9  Decreased Interest 3  Down, Depressed, Hopeless 2  PHQ - 2 Score 5  Altered sleeping 2  Tired, decreased energy 1  Change in appetite 0  Feeling bad or failure about yourself  2  Trouble concentrating 0  Moving slowly or fidgety/restless 1  Suicidal thoughts 0  PHQ-9 Score 11  Difficult doing work/chores Not difficult at all      09/15/2022   10:43 AM  GAD 7 : Generalized Anxiety Score  Nervous, Anxious, on Edge 3  Control/stop worrying 3  Worry too much - different things 3  Trouble relaxing 0  Restless 3  Easily annoyed or irritable 3  Afraid - awful might happen 3  Total GAD 7 Score 18  Anxiety Difficulty Somewhat difficult    Medications: Outpatient  Medications Prior to Visit  Medication Sig   amoxicillin (AMOXIL) 500 MG capsule Take 500 mg by mouth 3 (three) times daily.   Blood Pressure Monitoring (BLOOD PRESSURE KIT) DEVI Use to measure BP daily   butalbital-apap-caffeine-codeine (FIORICET WITH CODEINE) 50-325-40-30 MG capsule TAKE ONE TAB FOR MIGRAINE HEADACHES ONLY AS NEEDED QD   cetirizine (ZYRTEC) 10 MG tablet Take 1 tablet (10 mg total) by mouth daily.   DULoxetine (CYMBALTA) 20 MG capsule Take 2 capsules (40 mg total) by mouth daily.   famotidine (PEPCID) 20 MG tablet Take one tab po qd for stomach at lunch   ferrous sulfate 325 (65 FE) MG tablet Take by mouth.   hydrOXYzine (VISTARIL) 25 MG capsule Take 1 capsule (25 mg total) by mouth 3 (three) times daily.   ibuprofen (ADVIL) 600 MG tablet Take 1 tablet (600 mg total) by mouth every 8 (eight) hours as needed for moderate pain.   ketoconazole (NIZORAL) 2 % shampoo Apply 1 Application topically 2 (two) times a week. Leave on for 5 minutes before rinsing off.   linaclotide (LINZESS) 72 MCG capsule Take 1 capsule (72 mcg total) by mouth daily before breakfast.   norethindrone (AYGESTIN) 5 MG tablet Take 1 tablet by mouth once daily   rosuvastatin (CRESTOR) 10 MG tablet Take 1 tablet (10 mg total) by mouth daily.   rosuvastatin (CRESTOR) 5 MG tablet Take 1 tablet (5 mg total)  by mouth daily.   sitaGLIPtin (JANUVIA) 100 MG tablet Take 1 tablet (100 mg total) by mouth daily.   topiramate (TOPAMAX) 25 MG tablet Take one tab po qhs for headaches   topiramate (TOPAMAX) 50 MG tablet Take 1 tablet by mouth 2 (two) times daily.   triamcinolone ointment (KENALOG) 0.1 % Apply twice daily to affected areas until smooth. Avoid applying to face, groin, and axilla. Use as directed. Long-term use can cause thinning of the skin.   No facility-administered medications prior to visit.    Review of Systems All negative Except see HPI       Objective    BP 136/80 (BP Location: Right Arm,  Patient Position: Sitting, Cuff Size: Normal)   Pulse 64   Ht 5\' 3"  (1.6 m)   Wt 179 lb (81.2 kg)   SpO2 97%   BMI 31.71 kg/m     Physical Exam Vitals reviewed.  Constitutional:      General: She is not in acute distress.    Appearance: Normal appearance. She is well-developed. She is not diaphoretic.  HENT:     Head: Normocephalic and atraumatic.  Eyes:     General: No scleral icterus.    Conjunctiva/sclera: Conjunctivae normal.  Neck:     Thyroid: No thyromegaly.  Cardiovascular:     Rate and Rhythm: Normal rate and regular rhythm.     Pulses: Normal pulses.     Heart sounds: Normal heart sounds. No murmur heard. Pulmonary:     Effort: Pulmonary effort is normal. No respiratory distress.     Breath sounds: Normal breath sounds. No wheezing, rhonchi or rales.  Musculoskeletal:     Cervical back: Neck supple.     Right lower leg: No edema.     Left lower leg: No edema.  Lymphadenopathy:     Cervical: No cervical adenopathy.  Skin:    General: Skin is warm and dry.     Findings: No rash.  Neurological:     Mental Status: She is alert and oriented to person, place, and time. Mental status is at baseline.  Psychiatric:        Mood and Affect: Mood normal.        Behavior: Behavior normal.      No results found for any visits on 07/16/23.      Assessment and Plan    Abdominal Pain Chronic lower abdominal pain associated with both menses and food intake. -Refer to gastroenterologist for further evaluation. -Refer to OBGYN for evaluation of possible menstrual-related pain. -Encourage healthy diet and consider referral to a nutritionist.  Hyperlipidemia chronic High cholesterol levels noted on recent blood work. No current cholesterol medication on record. -Start Crestor for cholesterol management.  DMII/Hyperglycemia chronic Recent blood glucose levels have increased from 7 to 8 despite current medication. -Continue current glucose medication. -Add new  glucose medication. -Provide glucometer and instruct to check blood glucose daily before meals. If glucose drops below 70, stop medication and contact the office. -Schedule follow-up in one month to assess glucose control and medication compliance.  Hypertension Chronic Elevated BP reading of more than 130 Advised lifestyle modifications: low salt diet and exercise Will follow-up  Obesity Chronic Body mass index is 31.71 kg/m. Weight loss of 5% of pt's current weight via healthy diet and daily exercise encouraged. Will follow-up  Hyperlipidemia associated with type 2 diabetes mellitus (HCC) - AMB Referral [facility] Care Management  Obesity (BMI 30.0-34.9) - Referral to Nutrition and Diabetes Services  Diabetes  mellitus due to underlying condition with other circulatory complication, without long-term current use of insulin (HCC) (Primary) - Referral to Nutrition and Diabetes Services - glipiZIDE (GLUCOTROL) 5 MG tablet; Take 1 tablet (5 mg total) by mouth daily before breakfast.  Dispense: 90 tablet; Refill: 3 - AMB Referral [facility] Care Management  Lower abdominal pain - omeprazole (PRILOSEC) 20 MG capsule; Take 1 capsule (20 mg total) by mouth daily.  Dispense: 90 capsule; Refill: 3 Language barrier - AMB Referral [facility] Care Management Lack of education - AMB Referral [facility] Care Management  General Health Maintenance -Ensure all medications are sent with refills to avoid gaps in treatment. -Provide son's contact information for scheduling appointments after 3 PM.     No orders of the defined types were placed in this encounter.   Return in about 4 weeks (around 08/13/2023) for chronic disease f/u.   The patient was advised to call back or seek an in-person evaluation if the symptoms worsen or if the condition fails to improve as anticipated.  I discussed the assessment and treatment plan with the patient. The patient was provided an opportunity to ask questions and all were  answered. The patient agreed with the plan and demonstrated an understanding of the instructions.  I, Debera Lat, PA-C have reviewed all documentation for this visit. The documentation on 07/16/2023  for the exam, diagnosis, procedures, and orders are all accurate and complete.  Debera Lat, [facility], MMS [facility] [phone] (phone) [phone] (fax)  [facility]

## 2023-07-21 ENCOUNTER — Telehealth: Payer: Self-pay

## 2023-07-21 NOTE — Progress Notes (Signed)
 Care Guide Pharmacy Note  07/21/2023 Name: Kirsten Mccann MRN: [id number] DOB: [birth year]  Referred By: Ostwalt, Janna, PA-C Reason for referral: Care Coordination (Outreach to schedule with pharm d )   Kirsten Mccann is a 48 y.o. year old female who is a primary care patient of Ostwalt, Janna, PA-C.  Kirsten Mccann was referred to the pharmacist for assistance related to: HLD and DMII  An unsuccessful telephone outreach was attempted today to contact the patient who was referred to the pharmacy team for assistance with medication management. Additional attempts will be made to contact the patient.  Jeoffrey Buffalo , RMA     [facility]  [facility], [facility] Guide  Direct Dial: [phone]  Website: [email or web address]

## 2023-07-28 NOTE — Progress Notes (Signed)
 Care Guide Pharmacy Note  07/28/2023 Name: [name] MRN: [id number] DOB: 01-01-1976  Referred By: Debera Lat, PA-C Reason for referral: Care Coordination (Outreach to schedule with pharm d )   Kirsten Mccann is a 48 y.o. year old female who is a primary care patient of Debera Lat, [place].  Blanch Clonch was referred to the pharmacist for assistance related to: HLD and DMII  A second unsuccessful telephone outreach was attempted today to contact the patient who was referred to the pharmacy team for assistance with medication management. Additional attempts will be made to contact the patient.  Penne Lash , RMA     [facility]  [facility], [facility] Guide  Direct Dial: [phone]  Website: [email or web address]

## 2023-07-29 ENCOUNTER — Encounter: Payer: Self-pay | Admitting: Dietician

## 2023-07-29 ENCOUNTER — Encounter: Payer: Medicaid Other | Attending: Physician Assistant | Admitting: Dietician

## 2023-07-29 DIAGNOSIS — E119 Type 2 diabetes mellitus without complications: Secondary | ICD-10-CM | POA: Diagnosis present

## 2023-07-29 DIAGNOSIS — E66811 Obesity, class 1: Secondary | ICD-10-CM | POA: Diagnosis not present

## 2023-07-29 DIAGNOSIS — Z713 Dietary counseling and surveillance: Secondary | ICD-10-CM | POA: Diagnosis not present

## 2023-07-29 DIAGNOSIS — Z7984 Long term (current) use of oral hypoglycemic drugs: Secondary | ICD-10-CM | POA: Diagnosis not present

## 2023-07-29 NOTE — Progress Notes (Signed)
Diabetes Self-Management Education  Visit Type: First/Initial  Appt. Start Time: 1115  Appt. End Time: 1220  07/29/2023  Kirsten Mccann, identified by name and date of birth, is a 48 y.o. female with a diagnosis of Diabetes: Type 2.   ASSESSMENT Pt son, Karolee Ohs, and Pashto interpreter, Salvadore Oxford, present for appointment. Pt reports goal of improving A1c and lowering cholesterol/lipids (06/29/2023 - TC - 219, TGL - 359, LDL - 127) reports taking Glipizide and Sitagliptin for DM, reports no side effects. Pt reports checking glucose fasting and in the evening for the last week, fasting numbers are ~120 - 140, evening numbers are ~80-150 mg/dL. CBG checked during visit, 108 mg/dL, pt reports eating a small sweet and having chai tea at 4 am this morning. Pt reports constant pain all over their body, states they want to feel better. Pt reports constant burning/cramping in their stomach, also reports polyuria. Pt reports doing exercise at home, walking around neighborhood for ~60 minutes.    Diabetes Self-Management Education - 07/29/23 1122       Visit Information   Visit Type First/Initial      Initial Visit   Diabetes Type Type 2    Date Diagnosed 3 years ago    Are you currently following a meal plan? No    Are you taking your medications as prescribed? Yes      Health Coping   How would you rate your overall health? Poor   Constant pain     Psychosocial Assessment   Patient Belief/Attitude about Diabetes Afraid    What is the hardest part about your diabetes right now, causing you the most concern, or is the most worrisome to you about your diabetes?   Making healty food and beverage choices;Getting support / problem solving    Self-care barriers Debilitated state due to current medical condition   Pain   Self-management support Doctor's office;Family    Other persons present Patient;Family Member;Interpreter    Patient Concerns Nutrition/Meal planning;Problem Solving;Glycemic  Control;Medication    Special Needs None    Preferred Learning Style Auditory;Visual    Learning Readiness Ready    How often do you need to have someone help you when you read instructions, pamphlets, or other written materials from your doctor or pharmacy? 5 - Always   Translation   What is the last grade level you completed in school? Elementary      Pre-Education Assessment   Patient understands the diabetes disease and treatment process. Needs Instruction    Patient understands incorporating nutritional management into lifestyle. Needs Instruction    Patient undertands incorporating physical activity into lifestyle. Needs Instruction    Patient understands using medications safely. Needs Instruction    Patient understands monitoring blood glucose, interpreting and using results Needs Instruction    Patient understands prevention, detection, and treatment of acute complications. Needs Instruction    Patient understands prevention, detection, and treatment of chronic complications. Needs Instruction    Patient understands how to develop strategies to address psychosocial issues. Needs Instruction    Patient understands how to develop strategies to promote health/change behavior. Needs Instruction      Complications   Last HgB A1C per patient/outside source 8.2 %   06/29/2023   How often do you check your blood sugar? 1-2 times/day    Fasting Blood glucose range (mg/dL) 16-109    Postprandial Blood glucose range (mg/dL) 604-540;98-119    Have you had a dilated eye exam in the past 12 months?  Yes    Have you had a dental exam in the past 12 months? Yes    Are you checking your feet? Yes    How many days per week are you checking your feet? 1      Dietary Intake   Breakfast 6" Parata (Naan) with oil, chai tea w/ sugar milk    Lunch Vegetable or meat, tomato curry, naan bread    Dinner Vegetable or meat, tomato curry, naan bread    Beverage(s) Water, chai      Activity / Exercise    Activity / Exercise Type ADL's;Light (walking / raking leaves)    How many days per week do you exercise? 5    How many minutes per day do you exercise? 30    Total minutes per week of exercise 150      Patient Education   Previous Diabetes Education No    Disease Pathophysiology Explored patient's options for treatment of their diabetes;Factors that contribute to the development of diabetes    Healthy Eating Role of diet in the treatment of diabetes and the relationship between the three main macronutrients and blood glucose level;Meal timing in regards to the patients' current diabetes medication.    Being Active Role of exercise on diabetes management, blood pressure control and cardiac health.    Medications Reviewed patients medication for diabetes, action, purpose, timing of dose and side effects.    Monitoring Taught/evaluated SMBG meter.;Identified appropriate SMBG and/or A1C goals.    Acute complications Taught prevention, symptoms, and  treatment of hypoglycemia - the 15 rule.    Chronic complications Lipid levels, blood glucose control and heart disease      Individualized Goals (developed by patient)   Nutrition Follow meal plan discussed    Physical Activity Exercise 5-7 days per week    Medications take my medication as prescribed    Monitoring  Test my blood glucose as discussed    Problem Solving Eating Pattern;Medication consistency    Reducing Risk treat hypoglycemia with 15 grams of carbs if blood glucose less than 70mg /dL;examine blood glucose patterns      Post-Education Assessment   Patient understands the diabetes disease and treatment process. Needs Review    Patient understands incorporating nutritional management into lifestyle. Needs Review    Patient undertands incorporating physical activity into lifestyle. Needs Review    Patient understands using medications safely. Needs Review    Patient understands monitoring blood glucose, interpreting and using results  Needs Review    Patient understands prevention, detection, and treatment of acute complications. Needs Review    Patient understands prevention, detection, and treatment of chronic complications. Needs Review    Patient understands how to develop strategies to address psychosocial issues. Needs Review    Patient understands how to develop strategies to promote health/change behavior. Needs Review      Outcomes   Expected Outcomes Demonstrated interest in learning but significant barriers to change    Future DMSE PRN    Program Status Completed             Individualized Plan for Diabetes Self-Management Training:   Learning Objective:  Patient will have a greater understanding of diabetes self-management. Patient education plan is to attend individual and/or group sessions per assessed needs and concerns.   Plan:   Patient Instructions  Take your Rosuvastatin as prescribed to help lower your cholesterol, Omeprazole to help with burning in your stomach.  Choose to have Parata without oil in the morning,  have 1/2 a Naan and more vegetables in your curry!  Be sure to at least eat a small amount of food around lunch and dinner time when taking Glipizide.  If you experience shakiness, sweatiness, weakness, or faintness, check your blood sugar. If it is below 70, have a small glass of juice or milk to get it back up. Then have a small snack within an hour!  Check your blood sugar each morning before eating or drinking (fasting). Look for numbers under 130 mg/dL Check your blood sugar 2 hours after you begin eating a meal. Look for numbers under 180 mg/dL at all times.  Your goal A1c is below 7.0%     Expected Outcomes:  Demonstrated interest in learning but significant barriers to change  If problems or questions, patient to contact team via:  Phone and Email  Future DSME appointment: PRN

## 2023-07-29 NOTE — Patient Instructions (Addendum)
Take your Rosuvastatin as prescribed to help lower your cholesterol, Omeprazole to help with burning in your stomach.  Choose to have Parata without oil in the morning, have 1/2 a Naan and more vegetables in your curry!  Be sure to at least eat a small amount of food around lunch and dinner time when taking Glipizide.  If you experience shakiness, sweatiness, weakness, or faintness, check your blood sugar. If it is below 70, have a small glass of juice or milk to get it back up. Then have a small snack within an hour!  Check your blood sugar each morning before eating or drinking (fasting). Look for numbers under 130 mg/dL Check your blood sugar 2 hours after you begin eating a meal. Look for numbers under 180 mg/dL at all times.  Your goal A1c is below 7.0%

## 2023-07-30 ENCOUNTER — Telehealth: Payer: Self-pay | Admitting: Physician Assistant

## 2023-07-30 NOTE — Telephone Encounter (Signed)
Patient dropped off glucose readings today.  They are placed in providers mailbox to be picked up.

## 2023-08-03 ENCOUNTER — Ambulatory Visit (INDEPENDENT_AMBULATORY_CARE_PROVIDER_SITE_OTHER): Payer: Medicaid Other | Admitting: Dermatology

## 2023-08-03 ENCOUNTER — Encounter: Payer: Self-pay | Admitting: Dermatology

## 2023-08-03 DIAGNOSIS — L209 Atopic dermatitis, unspecified: Secondary | ICD-10-CM | POA: Diagnosis not present

## 2023-08-03 MED ORDER — TACROLIMUS 0.1 % EX OINT: TOPICAL_OINTMENT | CUTANEOUS | 0 refills | Status: DC

## 2023-08-03 NOTE — Progress Notes (Signed)
   Follow-Up Visit   Subjective  Kirsten Mccann is a 48 y.o. female who presents for the following: Atopic Dermatitis - rash is resolved but patient continues to itch on the arms - currently she is using Ketoconazole 2% shampoo as a wash. She did try the TMC 0.1% ointment for two months, but no longer uses it because it didn't help with itching.  The following portions of the chart were reviewed this encounter and updated as appropriate: medications, allergies, medical history  Review of Systems:  No other skin or systemic complaints except as noted in HPI or Assessment and Plan.  Objective  Well appearing patient in no apparent distress; mood and affect are within normal limits.  Areas Examined: the arms   Relevant physical exam findings are noted in the Assessment and Plan.    Assessment & Plan   ATOPIC DERMATITIS, UNSPECIFIED TYPE    ATOPIC DERMATITIS Exam: Scaly pink papules coalescing to plaques on the volar arms lower legs back 5-10% BSA  Chronic and persistent condition with duration or expected duration over one year. Condition is symptomatic/ bothersome to patient. Improved but still flaring, Not currently at goal.  Atopic dermatitis (eczema) is a chronic, relapsing, pruritic condition that can significantly affect quality of life. It is often associated with allergic rhinitis and/or asthma and can require treatment with topical medications, phototherapy, or in severe cases biologic injectable medication (Dupixent; Adbry) or Oral JAK inhibitors.  Treatment Plan: Discussed Dupixent injection and side effects. Dupilumab (Dupixent) is a treatment given by injection for adults and children with moderate-to-severe atopic dermatitis. Goal is control of skin condition, not cure. It is given as 2 injections at the first dose followed by 1 injection ever 2 weeks thereafter.    Potential side effects include allergic reaction, herpes infections, injection site reactions and  conjunctivitis (inflammation of the eyes).  The use of Dupixent requires long term medication management, including periodic office visits.  Start Tacrolimus 0.1% ointment BID PRN. If not improving start Dupixent 300mg /63mL at next visit in one month.   Recommend gentle skin care.  Return in about 1 month (around 08/31/2023) for atopic dermatitis follow up.  Maylene Roes, CMA, am acting as scribe for Elie Goody, MD .  Documentation: I have reviewed the above documentation for accuracy and completeness, and I agree with the above.  Elie Goody, MD

## 2023-08-03 NOTE — Patient Instructions (Signed)

## 2023-08-05 ENCOUNTER — Other Ambulatory Visit: Payer: Self-pay | Admitting: Dermatology

## 2023-08-12 ENCOUNTER — Ambulatory Visit: Payer: Medicaid Other | Admitting: Physician Assistant

## 2023-08-13 NOTE — Progress Notes (Signed)
 Care Guide Pharmacy Note  08/13/2023 Name: Kirsten Mccann MRN: [id number] DOB: Apr 27, 1976  Referred By: Debera Lat, PA-C Reason for referral: Care Coordination (Outreach to schedule with pharm d )   Kirsten Mccann is a 48 y.o. year old female who is a primary care patient of Debera Lat, [place].  Kirsten Mccann was referred to the pharmacist for assistance related to: HLD and DMII  A third unsuccessful telephone outreach was attempted today to contact the patient who was referred to the pharmacy team for assistance with medication management. The Population Health team is pleased to engage with this patient at any time in the future upon receipt of referral and should he/she be interested in assistance from the [facility] team.  Penne Lash , RMA     [facility]  [facility] Guide  Direct Dial: [phone]  Website: [email or web address]

## 2023-09-02 ENCOUNTER — Other Ambulatory Visit: Payer: Self-pay

## 2023-09-02 DIAGNOSIS — L03211 Cellulitis of face: Secondary | ICD-10-CM | POA: Insufficient documentation

## 2023-09-02 DIAGNOSIS — K047 Periapical abscess without sinus: Secondary | ICD-10-CM | POA: Diagnosis not present

## 2023-09-02 DIAGNOSIS — K0889 Other specified disorders of teeth and supporting structures: Secondary | ICD-10-CM | POA: Diagnosis present

## 2023-09-02 LAB — CBC
HCT: 38.1 % (ref 36.0–46.0)
Hemoglobin: 12.5 g/dL (ref 12.0–15.0)
MCH: 28.4 pg (ref 26.0–34.0)
MCHC: 32.8 g/dL (ref 30.0–36.0)
MCV: 86.6 fL (ref 80.0–100.0)
Platelets: 221 10*3/uL (ref 150–400)
RBC: 4.4 MIL/uL (ref 3.87–5.11)
RDW: 12.6 % (ref 11.5–15.5)
WBC: 10 10*3/uL (ref 4.0–10.5)
nRBC: 0 % (ref 0.0–0.2)

## 2023-09-02 LAB — BASIC METABOLIC PANEL
Anion gap: 10 (ref 5–15)
BUN: 12 mg/dL (ref 6–20)
CO2: 21 mmol/L — ABNORMAL LOW (ref 22–32)
Calcium: 9 mg/dL (ref 8.9–10.3)
Chloride: 103 mmol/L (ref 98–111)
Creatinine, Ser: 0.76 mg/dL (ref 0.44–1.00)
GFR, Estimated: >60 mL/min (ref >60)
Glucose, Bld: 200 mg/dL — ABNORMAL HIGH (ref 70–99)
Potassium: 4.1 mmol/L (ref 3.5–5.1)
Sodium: 134 mmol/L — ABNORMAL LOW (ref 135–145)

## 2023-09-02 MED ORDER — OXYCODONE-ACETAMINOPHEN 5-325 MG PO TABS
1.0000 | ORAL_TABLET | Freq: Once | ORAL | Status: AC
  Administered 2023-09-02: 1 via ORAL
  Filled 2023-09-02: qty 1

## 2023-09-02 NOTE — ED Triage Notes (Signed)
 Pt to ED via POV c/o oral pain and swelling. Pt reports having pain to left upper and lower teeth that started last night. Pt was seen at [facility] and was given amoxicillin and pain medication. Pt has taken a dose of meds and reports pain has not gotten any better. Pt with swelling to left cheek.

## 2023-09-03 ENCOUNTER — Emergency Department
Admission: EM | Admit: 2023-09-03 | Discharge: 2023-09-03 | Disposition: A | Discharge status: Home / Self Care | Attending: Emergency Medicine | Admitting: Emergency Medicine

## 2023-09-03 ENCOUNTER — Emergency Department

## 2023-09-03 DIAGNOSIS — K047 Periapical abscess without sinus: Secondary | ICD-10-CM

## 2023-09-03 DIAGNOSIS — L03211 Cellulitis of face: Secondary | ICD-10-CM

## 2023-09-03 MED ORDER — MORPHINE SULFATE (PF) 4 MG/ML IV SOLN
4.0000 mg | Freq: Once | INTRAVENOUS | Status: AC
  Administered 2023-09-03: 4 mg via INTRAVENOUS
  Filled 2023-09-03: qty 1

## 2023-09-03 MED ORDER — ONDANSETRON HCL 4 MG/2ML IJ SOLN
4.0000 mg | INTRAMUSCULAR | Status: AC
  Administered 2023-09-03: 4 mg via INTRAVENOUS
  Filled 2023-09-03: qty 2

## 2023-09-03 MED ORDER — IOHEXOL 300 MG/ML  SOLN
75.0000 mL | Freq: Once | INTRAMUSCULAR | Status: AC | PRN
  Administered 2023-09-03: 75 mL via INTRAVENOUS

## 2023-09-03 MED ORDER — SODIUM CHLORIDE 0.9 % IV SOLN
3.0000 g | INTRAVENOUS | Status: AC
  Administered 2023-09-03: 3 g via INTRAVENOUS
  Filled 2023-09-03: qty 8

## 2023-09-03 MED ORDER — DEXAMETHASONE SODIUM PHOSPHATE 10 MG/ML IJ SOLN
10.0000 mg | Freq: Once | INTRAMUSCULAR | Status: AC
  Administered 2023-09-03: 10 mg via INTRAVENOUS
  Filled 2023-09-03: qty 1

## 2023-09-03 MED ORDER — KETOROLAC TROMETHAMINE 30 MG/ML IJ SOLN
15.0000 mg | Freq: Once | INTRAMUSCULAR | Status: AC
  Administered 2023-09-03: 15 mg via INTRAVENOUS
  Filled 2023-09-03: qty 1

## 2023-09-03 NOTE — ED Provider Notes (Signed)
 [facility] Provider Note    Event Date/Time   First MD Initiated Contact with Patient 09/03/23 0110     (approximate)   History   Dental Pain and Oral Swelling  Pashto virtual hospital interpreter utilized for H&P.  HPI Kirsten Mccann is a 48 y.o. female presents for evaluation of about 24 hours of left-sided dental pain and facial swelling.  She states it started [place] the night last night and has continued at about the same level.  The swelling is gotten little bit worse but the pain has always been severe.  She has pain in both the upper and lower teeth on her left side with a history of some dental problems.  The swelling has extended to her left lower eyelid and she is holding her eye closed.  No visual changes.  No recent fever.  She is able to open her mouth wide without it hurting.  No difficulty swallowing or speaking.  No pain in her neck/throat.     Physical Exam   Triage Vital Signs: ED Triage Vitals  Encounter Vitals Group     BP 09/02/23 2025 (!) 143/79     Systolic BP Percentile --      Diastolic BP Percentile --      Pulse Rate 09/02/23 2025 88     Resp 09/02/23 2025 20     Temp 09/02/23 2025 99.8 F (37.7 C)     Temp Source 09/02/23 2025 Oral     SpO2 09/02/23 2025 96 %     Weight --      Height --      Head Circumference --      Peak Flow --      Pain Score 09/02/23 2022 10     Pain Loc --      Pain Education --      Exclude from Growth Chart --     Most recent vital signs: Vitals:   09/02/23 2025 09/03/23 0209  BP: (!) 143/79 (!) 119/98  Pulse: 88 77  Resp: 20 20  Temp: 99.8 F (37.7 C) 99.8 F (37.7 C)  SpO2: 96% 98%    General: Awake, appears uncomfortable but nontoxic. HEENT: Patient has edema of the left lower orbit most consistent with periorbital cellulitis or facial cellulitis as she also has swelling down throughout the left side of her face.  Tender to palpation.  She will not open her eye but when she is  encouraged to do so and when I manually open her left eye, she has no chemosis, no proptosis, and normal extraocular movement.  Pupils are equal and reactive.  She has no trismus but obvious left-sided facial swelling is present.  Poor dentition in general but without any obvious dental abscesses or dental fractures. CV:  Good peripheral perfusion.  Regular rate and rhythm. Resp:  Normal effort. Speaking easily and comfortably, no accessory muscle usage nor intercostal retractions.   Abd:  No distention.    ED Results / Procedures / Treatments   Labs (all labs ordered are listed, but only abnormal results are displayed) Labs Reviewed  BASIC METABOLIC PANEL - Abnormal; Notable for the following components:      Result Value   Sodium 134 (*)    CO2 21 (*)    Glucose, Bld 200 (*)    All other components within normal limits  CBC      RADIOLOGY See hospital course for details regarding CT maxillofacial study.   PROCEDURES:  Critical Care performed: No  Procedures    IMPRESSION / MDM / ASSESSMENT AND PLAN / ED COURSE  I reviewed the triage vital signs and the nursing notes.                              Differential diagnosis includes, but is not limited to, odontogenic infection, facial cellulitis, facial abscess, periorbital cellulitis, orbital cellulitis.  Patient's presentation is most consistent with acute presentation with potential threat to life or bodily function.  Labs/studies ordered: BMP, CBC, CT maxillofacial with contrast  Interventions/Medications given:  Medications  oxyCODONE-acetaminophen (PERCOCET/ROXICET) 5-325 MG per tablet 1 tablet (1 tablet Oral Given 09/02/23 2234)  morphine (PF) 4 MG/ML injection 4 mg (4 mg Intravenous Given 09/03/23 0209)  ketorolac (TORADOL) 30 MG/ML injection 15 mg (15 mg Intravenous Given 09/03/23 0208)  Ampicillin-Sulbactam (UNASYN) 3 g in sodium chloride 0.9 % 100 mL IVPB (3 g Intravenous New Bag/Given 09/03/23 0208)   dexamethasone (DECADRON) injection 10 mg (10 mg Intravenous Given 09/03/23 0209)  iohexol (OMNIPAQUE) 300 MG/ML solution 75 mL (75 mLs Intravenous Contrast Given 09/03/23 0301)  ondansetron (ZOFRAN) injection 4 mg (4 mg Intravenous Given 09/03/23 0234)    (Note:  hospital course my include additional interventions and/or labs/studies not listed above.)   Vital signs are stable and within normal limits.  No leukocytosis and essentially normal basic metabolic panel.  Glucose is slightly elevated consistent with history of diabetes.    I am concerned about the facial swelling that extends up to the left lower orbit.  I ordered Unasyn 3 g IV, Decadron 10 mg IV, Toradol 15 mg IV, morphine 4 mg IV, and will obtain a CT maxillofacial with IV contrast to assess for the possibility of odontogenic infection extending throughout the face.  I was advised by the CT technologist that I do not need to get a dedicated CT orbits since we are getting maxillofacial.  I also advised the patient that the Decadron will elevate her glucose which she checks regularly due to her diabetes and she acknowledged and understands.   Clinical Course as of 09/03/23 1610  Fri Sep 03, 2023  0510 CT Maxillofacial W Contrast I viewed and interpreted the patient's CT maxillofacial.  She has an area that appears consistent with abscess around tooth #10.  Radiology confirmed an odontogenic abscess with surrounding and associated cellulitis.  There is no easily accessible drainable fluid collection from an emergency department perspective.  I reassessed the patient and it appears that her swelling has gone down, her eyes now fully open and continues to have no proptosis or other concerning findings suggestive of orbital cellulitis which would have been seen on the CT scan regardless.  I talked with her and her son via the interpreter about the results and she feels much better and is comfortable with the plan for discharge.  I stressed  to her multiple times the importance of following up with a dentist or oral surgeon, and I gave her the name and phone number of Dr. Metta Clines, our local OMFS/dental specialist.  I verified by looking at the individual bottles that the patient has been prescribed Augmentin, topical lidocaine, and a narcotic to help with the pain.  I encouraged her to take all of these medications as written and to follow-up with either Dr. Metta Clines or another dentist or OMFS specialist at the next available opportunity.  They understand and agree with the plan. [CF]  Clinical Course User Index [CF] Loleta Rose, MD     FINAL CLINICAL IMPRESSION(S) / ED DIAGNOSES   Final diagnoses:  Dental abscess  Facial cellulitis     Rx / DC Orders   ED Discharge Orders     None        Note:  This document was prepared using Dragon voice recognition software and may include unintentional dictation errors.   Loleta Rose, MD 09/03/23 [phone]

## 2023-09-03 NOTE — Discharge Instructions (Signed)
 Please take the medications you were previously prescribed, as written on the label instructions.  We recommend that you follow-up by calling the office of Dr. Metta Clines at the number provided.  Please let them know that you are following up from an emergency department visit and have an odontogenic abscess and would benefit from the next available appointment.  You can also consider looking around on the Internet for other local dentists or Oral and Maxillofacial Surgeons (OMFS).  If you feel that your symptoms are worsening or if you develop new or worsening symptoms, please return to the Emergency Department.

## 2023-09-06 ENCOUNTER — Ambulatory Visit: Payer: Medicaid Other | Admitting: Dermatology

## 2023-10-19 LAB — HM DIABETES EYE EXAM

## 2023-10-25 ENCOUNTER — Other Ambulatory Visit: Payer: Self-pay

## 2023-10-25 ENCOUNTER — Ambulatory Visit (INDEPENDENT_AMBULATORY_CARE_PROVIDER_SITE_OTHER): Admitting: Psychiatry

## 2023-10-25 ENCOUNTER — Encounter: Payer: Self-pay | Admitting: Psychiatry

## 2023-10-25 VITALS — BP 124/84 | HR 67 | Temp 97.7°F | Ht 63.0 in | Wt 176.8 lb

## 2023-10-25 DIAGNOSIS — F32A Depression, unspecified: Secondary | ICD-10-CM

## 2023-10-25 DIAGNOSIS — F4310 Post-traumatic stress disorder, unspecified: Secondary | ICD-10-CM | POA: Diagnosis not present

## 2023-10-25 DIAGNOSIS — F09 Unspecified mental disorder due to known physiological condition: Secondary | ICD-10-CM | POA: Insufficient documentation

## 2023-10-25 NOTE — Progress Notes (Unsigned)
 Psychiatric Initial Adult Assessment   Patient Identification: Kirsten Mccann MRN:  [id number] Date of Evaluation:  10/25/2023 Referral Source: Janna Ostwalt PA - C Chief Complaint:   Chief Complaint  Patient presents with   Establish Care   Depression   Post-Traumatic Stress Disorder   Visit Diagnosis:    ICD-10-CM   1. PTSD (post-traumatic stress disorder)  F43.10     2. Depression, unspecified depression type  F32.A     3. Cognitive disorder  F09       History of Present Illness:  Kirsten Mccann is a 48 year old Asian female originally from [place], married, unemployed, lives in [place] with her family, has a history of hyperlipidemia, diabetes mellitus, hypertension, depression was evaluated in office today, presented to establish care.  [place] came [place]  [place] ago as a refugee from [place].  She has 7 children altogether and had to leave 4 of her children behind.  That has been traumatic for her.  Ever since then she has been struggling with crying spells, sadness, memory problems, sleep problems.  Her primary doctor may have started her on a medication for her mood symptoms, likely noncompliant.  She believes it may have been duloxetine  how she does not have to prescription bottle with her today.  She does not know the dosage of the medication.  She is willing to bring it in the next session.  She reports being in traumatic events.  15 years ago her family members were killed while they were traveling together in a car in [place].  This was by [place].  She had significant memory problems around that time to the point that she did not recognize her children as per her son who was the patient in the session today.  Although that did get better afterwards she started having memory problems again recently.  She currently struggles with significant short-term memory issues.  She forgets things easily and has to repeat questions.  She also does not know her  date of birth, does not know her address and such personal details.  Although family has tried to teach her basic details they have not been successful.  Memory problems are a concern right now and that is another reason she was referred to this provider.  She does also struggle with flashbacks, intrusive memories and nightmares about her trauma.  She struggles with the fact that she had to leave her 4 children behind when she came to the [place]  3 years ago.  That has been traumatic to her as well.  She constantly worries about her children, has intrusive memories nightmares and flashbacks about this.  She has not been in psychotherapy and is interested in referral.  She does report having anxiety attacks although unable to elaborated further.  Denies any suicidality, homicidality or perceptual disturbances.  Today in session patient was unable to say the month, the year, the day, the date.  She could not give her date of birth.  She could not give her address.  She was able to answer correctly the name of current [place]  President.  She could not give answers to any other basic questions today and hence memory evaluation is limited.  Language barrier is also another problem.     Associated Signs/Symptoms: Depression Symptoms:  depressed mood, anhedonia, (Hypo) Manic Symptoms:  Denies Anxiety Symptoms:  Excessive Worry, Psychotic Symptoms:  Denies PTSD Symptoms: Had a traumatic exposure:  yes Re-experiencing:  Flashbacks Intrusive Thoughts Nightmares Hypervigilance:  Yes  Hyperarousal:  Difficulty Concentrating Emotional Numbness/Detachment  Past Psychiatric History: She was under the care of her primary provider who had initiated antidepressants previously.  She denies being under the care of a psychiatrist or a therapist previously.  Previous Psychotropic Medications: This needs to be verified however per review of medical records duloxetine , hydroxyzine  is on her medication  list although questionable compliance.  Substance Abuse History in the last 12 months:  No.  Consequences of Substance Abuse: Negative  Past Medical History:  Past Medical History:  Diagnosis Date   Anxiety    Depression    Diabetes (HCC)    Diabetes (HCC)    GERD (gastroesophageal reflux disease)    HLD    Hyperlipidemia     Past Surgical History:  Procedure Laterality Date   NO PAST SURGERIES      Family Psychiatric History: As noted below.  Family History:  Family History  Problem Relation Age of Onset   Mental illness Neg Hx     Social History:   Social History   Socioeconomic History   Marital status: Married    Spouse name: Not on file   Number of children: Not on file   Years of education: none   Highest education level: Not on file  Occupational History   Not on file  Tobacco Use   Smoking status: Never   Smokeless tobacco: Never  Vaping Use   Vaping status: Never Used  Substance and Sexual Activity   Alcohol use: Never   Drug use: Never   Sexual activity: Yes    Birth control/protection: None  Other Topics Concern   Not on file  Social History Narrative   Not on file   Social Drivers of Health   Financial Resource Strain: Not on file  Food Insecurity: Not on file  Transportation Needs: Not on file  Physical Activity: Not on file  Stress: Not on file  Social Connections: Not on file    Additional Social History: She was born and raised in [place].  She could not give much details about her childhood.  She is married.  She has 7 children altogether age between 76 to  37 years old.  3 of her children age between 73-17 are here with her.  They came here 3 years ago as refugees.  She had to leave 4 of her children aged between 66-23 in [place].  She currently lives in [place].  She is married and lives with her husband.  Her husband is supportive.  She is unemployed.  She does have a history of trauma as noted above.  She is religious.   Denies any legal problems.  Allergies:  No Known Allergies  Metabolic Disorder Labs: Lab Results  Component Value Date   HGBA1C 8.2 (H) 06/29/2023   No results found for: "PROLACTIN" Lab Results  Component Value Date   CHOL 219 (H) 06/29/2023   TRIG 359 (H) 06/29/2023   HDL 28 (L) 06/29/2023   CHOLHDL 7.8 (H) 06/29/2023   LDLCALC 127 (H) 06/29/2023   LDLCALC 125 (H) 02/04/2023   Lab Results  Component Value Date   TSH 0.479 02/04/2023    Therapeutic Level Labs: No results found for: "LITHIUM" No results found for: "CBMZ" No results found for: "VALPROATE"  Current Medications: Current Outpatient Medications  Medication Sig Dispense Refill   famotidine  (PEPCID ) 20 MG tablet Take one tab po qd for stomach at lunch 90 tablet 3   glipiZIDE  (GLUCOTROL ) 5 MG tablet Take 1  tablet (5 mg total) by mouth daily before breakfast. 90 tablet 3   meloxicam (MOBIC) 15 MG tablet Take 15 mg by mouth daily.     norethindrone  (AYGESTIN ) 5 MG tablet Take 1 tablet by mouth once daily 90 tablet 3   omeprazole  (PRILOSEC) 20 MG capsule Take 1 capsule (20 mg total) by mouth daily. 90 capsule 3   rosuvastatin  (CRESTOR ) 10 MG tablet Take 1 tablet (10 mg total) by mouth daily. 90 tablet 3   amoxicillin (AMOXIL) 500 MG capsule Take 500 mg by mouth 3 (three) times daily. (Patient not taking: Reported on 10/25/2023)     Blood Pressure Monitoring (BLOOD PRESSURE KIT) DEVI Use to measure BP daily (Patient not taking: Reported on 10/25/2023) 1 each 0   butalbital -apap-caffeine-codeine (FIORICET WITH CODEINE) 50-325-40-30 MG capsule TAKE ONE TAB FOR MIGRAINE HEADACHES ONLY AS NEEDED QD (Patient not taking: Reported on 10/25/2023) 30 capsule 0   cetirizine  (ZYRTEC ) 10 MG tablet Take 1 tablet (10 mg total) by mouth daily. (Patient not taking: Reported on 10/25/2023) 30 tablet 2   DULoxetine  (CYMBALTA ) 20 MG capsule Take 2 capsules (40 mg total) by mouth daily. (Patient not taking: Reported on 10/25/2023) 180 capsule 0    ferrous sulfate 325 (65 FE) MG tablet Take by mouth. (Patient not taking: Reported on 10/25/2023)     HYDROcodone-acetaminophen  (NORCO/VICODIN) 5-325 MG tablet Take 1 tablet by mouth every 6 (six) hours as needed. (Patient not taking: Reported on 10/25/2023)     hydrOXYzine  (VISTARIL ) 25 MG capsule Take 1 capsule (25 mg total) by mouth 3 (three) times daily. (Patient not taking: Reported on 10/25/2023) 90 capsule 0   ibuprofen  (ADVIL ) 600 MG tablet Take 1 tablet (600 mg total) by mouth every 8 (eight) hours as needed for moderate pain. (Patient not taking: Reported on 10/25/2023) 30 tablet 1   ketoconazole  (NIZORAL ) 2 % shampoo Apply 1 Application topically 2 (two) times a week. Leave on for 5 minutes before rinsing off. (Patient not taking: Reported on 10/25/2023) 120 mL 11   lidocaine (XYLOCAINE) 2 % solution Swish 5 mL in mouth, then spit out. Or, apply a total of 5 mL to the sore tooth with a cotton-tipped applicator or cotton ball. To not use more than every three hours. (Patient not taking: Reported on 10/25/2023)     linaclotide  (LINZESS ) 72 MCG capsule Take 1 capsule (72 mcg total) by mouth daily before breakfast. (Patient not taking: Reported on 10/25/2023) 30 capsule 0   silver sulfADIAZINE (SILVADENE) 1 % cream Apply topically. (Patient not taking: Reported on 10/25/2023)     sitaGLIPtin  (JANUVIA ) 100 MG tablet Take 1 tablet (100 mg total) by mouth daily. (Patient not taking: Reported on 10/25/2023) 90 tablet 3   tacrolimus  (PROTOPIC ) 0.1 % ointment Apply 2 grams twice daily to arms, legs and back (Patient not taking: Reported on 10/25/2023) 100 g 0   topiramate  (TOPAMAX ) 25 MG tablet Take one tab po qhs for headaches (Patient not taking: Reported on 10/25/2023) 90 tablet 1   topiramate  (TOPAMAX ) 50 MG tablet Take 1 tablet by mouth 2 (two) times daily. (Patient not taking: Reported on 10/25/2023)     triamcinolone  ointment (KENALOG ) 0.1 % Apply twice daily to affected areas until smooth. Avoid applying  to face, groin, and axilla. Use as directed. Long-term use can cause thinning of the skin. (Patient not taking: Reported on 10/25/2023) 454 g 11   No current facility-administered medications for this visit.    Musculoskeletal: Strength & Muscle Tone:  within normal limits Gait & Station: normal Patient leans: N/A  Psychiatric Specialty Exam: Review of Systems  Psychiatric/Behavioral:  Positive for dysphoric mood. The patient is nervous/anxious.     Blood pressure 124/84, pulse 67, temperature 97.7 F (36.5 C), temperature source Temporal, height 5\' 3"  (1.6 m), weight 176 lb 12.8 oz (80.2 kg).Body mass index is 31.32 kg/m.  General Appearance: Casual  Eye Contact:  Fair  Speech:  Normal Rate  Volume:  Normal  Mood:  Anxious and Depressed  Affect:  Congruent  Thought Process:  Linear and Descriptions of Associations: Intact  Orientation:  Other:  Self, situation  Thought Content:  Logical  Suicidal Thoughts:  No  Homicidal Thoughts:  No  Memory:  Immediate;   Fair  Judgement:  Fair  Insight:  Shallow  Psychomotor Activity:  Normal  Concentration:  Concentration: Poor and Attention Span: Poor  Recall:  Poor  Fund of Knowledge:Poor  Language: Fair  Akathisia:  No  Handed:  Right  AIMS (if indicated):  not done  Assets:  Desire for Improvement Social Support Transportation  ADL's:  Intact  Cognition: Limited  Sleep:  Fair   Screenings: GAD-7    Flowsheet Row Office Visit from 09/15/2022 in [facility]  Total GAD-7 Score 18      PHQ2-9    Flowsheet Row Office Visit from 09/15/2022 in [facility]  PHQ-2 Total Score 5  PHQ-9 Total Score 11      Flowsheet Row ED from 09/03/2023 in [facility] at [facility] ED from 12/30/2021 in [facility] Emergency Department at [facility]  C-SSRS RISK CATEGORY No Risk No Risk       Assessment and Plan: Sebrenia Arcement is a 48 year old Asian  female originally from [place] was evaluated in office today referred by primary care provider for management of her mood symptoms.  Discussed assessment and plan as noted below.   PTSD-unstable She has ongoing trauma related symptoms mostly because of leaving her children behind in [place] when [place]  [place] a [place] as well as from her previous trauma with the [place].  She has ongoing mood symptoms including anxiety, depression, nightmares flashbacks and memory problems.  She is currently on antidepressant however unable to verify her dosage.  She agrees to bring her medication list with her at her follow-up session.  Agrees to referral to psychotherapist in the meantime. - Will consider readjusting the dosage of her psychotropic medication or changing to a new SSRI/SNRI. - Referral for psychotherapy/trauma focused therapy.  Patient provided list.  Patient to establish care.  Depression unspecified-unstable Worsening sadness, crying spells mostly related to her children being separated from her in [place]. - Referral for psychotherapy, patient provided a list.  Cognitive disorder-unspecified-unstable Patient with ongoing memory problems since the past several years, getting worse. - Ambulatory referral to Neurology.  I have reviewed notes per primary provider Ms. Ostwalt-dated 02/04/2023-patient with possible PTSD/anxiety with significant anxiety forgetfulness and episodes of feeling like having a stroke.  Patient hence was referred to psychiatry.  Collateral information was obtained from son, Romilda Coaster who was present in session today and reports patient having episodes of forgetfulness as noted above.  Follow-up Follow-up in clinic in 4 weeks or sooner if needed.     Collaboration of Care: Referral or follow-up with counselor/therapist AEB patient referred to psychotherapist, provided a list.  Patient also referred to neurologist.  Patient/Guardian was advised Release of  Information must be  obtained prior to any record release in order to collaborate their care with an outside provider. Patient/Guardian was advised if they have not already done so to contact the registration department to sign all necessary forms in order for us  to release information regarding their care.   Consent: Patient/Guardian gives verbal consent for treatment and assignment of benefits for services provided during this visit. Patient/Guardian expressed understanding and agreed to proceed.  This note was generated in part or whole with voice recognition software. Voice recognition is usually quite accurate but there are transcription errors that can and very often do occur. I apologize for any typographical errors that were not detected and corrected.    Shefali Ng, MD 5/12/20253:09 PM

## 2023-10-25 NOTE — Patient Instructions (Signed)
  [email or web address]  www.psychologytoday   [employer] [email or web address] [address], [place], [place] [address]  [phone]  [facility] [email or web address] [address] [place], [place] [address]  [phone]   [facility] - [phone]  [facility] - [phone]  [facility] - [phone] counseling [phone]  [facility] [phone]   Medicaid below :  [facility] Psychotherapy, Trauma & Addiction Counseling [address] [place], [place] [address]  [phone]    Estela Held [address] [place], [place] [address]  [phone]    [facility] [address] [place], [place] [address]  [phone]

## 2023-12-22 ENCOUNTER — Ambulatory Visit: Admitting: Psychiatry

## 2023-12-25 ENCOUNTER — Other Ambulatory Visit: Payer: Self-pay

## 2023-12-25 ENCOUNTER — Emergency Department
Admission: EM | Admit: 2023-12-25 | Discharge: 2023-12-25 | Disposition: A | Discharge status: Home / Self Care | Attending: Emergency Medicine | Admitting: Emergency Medicine

## 2023-12-25 DIAGNOSIS — K029 Dental caries, unspecified: Secondary | ICD-10-CM | POA: Diagnosis not present

## 2023-12-25 DIAGNOSIS — I10 Essential (primary) hypertension: Secondary | ICD-10-CM | POA: Insufficient documentation

## 2023-12-25 DIAGNOSIS — E119 Type 2 diabetes mellitus without complications: Secondary | ICD-10-CM | POA: Diagnosis not present

## 2023-12-25 DIAGNOSIS — K047 Periapical abscess without sinus: Secondary | ICD-10-CM | POA: Diagnosis not present

## 2023-12-25 DIAGNOSIS — K0889 Other specified disorders of teeth and supporting structures: Secondary | ICD-10-CM | POA: Diagnosis present

## 2023-12-25 MED ORDER — HYDROCODONE-ACETAMINOPHEN 5-325 MG PO TABS
1.0000 | ORAL_TABLET | Freq: Once | ORAL | Status: AC
  Administered 2023-12-25: 1 via ORAL
  Filled 2023-12-25: qty 1

## 2023-12-25 MED ORDER — HYDROCODONE-ACETAMINOPHEN 5-325 MG PO TABS: 1.0000 | ORAL_TABLET | Freq: Three times a day (TID) | ORAL | 0 refills | Status: DC | PRN

## 2023-12-25 MED ORDER — AMOXICILLIN-POT CLAVULANATE 875-125 MG PO TABS
1.0000 | ORAL_TABLET | Freq: Once | ORAL | Status: AC
  Administered 2023-12-25: 1 via ORAL
  Filled 2023-12-25: qty 1

## 2023-12-25 MED ORDER — AMOXICILLIN-POT CLAVULANATE 875-125 MG PO TABS: 1.0000 | ORAL_TABLET | Freq: Two times a day (BID) | ORAL | 0 refills | Status: DC

## 2023-12-25 NOTE — ED Triage Notes (Signed)
 Pt to ED with family member for upper L dental pain since last night. Also endorses ear pain to both sides.  Pt speaks Pashto, family member interpreted. Online audio interpreter was not available yet.

## 2023-12-25 NOTE — Discharge Instructions (Addendum)
 Take the antibiotic as directed. Take the pain medicine as needed. Rinse with warm  salt water after every meal.  Use a soft-bristled toothbrush 2-3 times daily. Follow-up with one of the dental clinics listed below.  OPTIONS FOR DENTAL FOLLOW UP CARE  [place] [facility] and [facility] Services - Local Safety Net Dental Clinics [email or web address]   [facility] [phone])  Norita Goldberg [phone])  [place] [phone] ext [phone])  [facility] [phone])  [facility] [phone]) This clinic caters to the indigent population and is on a lottery system. Location: [employer] of Dentistry, [employer], [address] [place] Clinic Hours: Wednesdays from 6pm - 9pm, patients seen by a lottery system. For dates, call or go to [email or web address] Services: Cleanings, fillings and simple extractions. Payment Options: DENTAL WORK IS FREE OF CHARGE. Bring proof of income or support. Best way to get seen: Arrive at 5:15 pm - this is a lottery, NOT first come/first serve, so arriving earlier will not increase your chances of being seen.     [facility] [phone] Select option 1 for emergencies   Location: [facility] of Dentistry, [place], [address], [place] Clinic Hours: No walk-ins accepted - call the day before to schedule an appointment. Check in times are 9:30 am and 1:30 pm. Services: Simple extractions, temporary fillings, pulpectomy/pulp debridement, uncomplicated abscess drainage. Payment Options: PAYMENT IS DUE AT THE TIME OF SERVICE.  Fee is usually $100-200, additional surgical procedures (e.g. abscess drainage) may be extra. Cash, checks, Visa/MasterCard accepted.  Can file Medicaid if patient is covered for dental - patient should call case worker to check. No discount for [facility] patients. Best way to get seen: MUST call the day before and get onto the schedule. Can usually be seen the next 1-2 days. No walk-ins accepted.     [facility] Dental Services [phone]   Location: [facility], [address], [place] Clinic Hours: M, W, Th, F 8am or 1:30pm, Tues 9a or 1:30 - first come/first served. Services: Simple extractions, temporary fillings, uncomplicated abscess drainage.  You do not need to be an [facility] resident. Payment Options: PAYMENT IS DUE AT THE TIME OF SERVICE. Dental insurance, otherwise sliding scale - bring proof of income or support. Depending on income and treatment needed, cost is usually $50-200. Best way to get seen: Arrive early as it is first come/first served.     [facility] [phone]   Location: [address] Clinic Hours: Mon-Thu 8a-5p Services: Most basic dental services including extractions and fillings. Payment Options: PAYMENT IS DUE AT THE TIME OF SERVICE. Sliding scale, up to 50% off - bring proof if income or support. Medicaid with dental option accepted. Best way to get seen: Call to schedule an appointment, can usually be seen within 2 weeks OR they will try to see walk-ins - show up at 8a or 2p (you may have to wait).     [facility] [phone] [place] RESIDENTS ONLY   Location: [facility], [address] [place], [place] [address] Clinic Hours: By appointment only. Monday - Thursday 8am-5pm, Friday 8am-12pm Services: Cleanings, fillings, extractions. Payment Options: PAYMENT IS DUE AT THE TIME OF SERVICE. Cash, Visa or MasterCard. Sliding scale - $30 minimum per service. Best way to get seen: Come in to office, complete packet and make an appointment - need proof of  income or support monies for each household member and proof of [facility] residence. Usually takes  about a month to get in.     [facility] [phone]   Location: [address], [facility] Clinic Hours: Walk-in Urgent Care Dental Services are offered Monday-Friday mornings only. The numbers of emergencies accepted daily is limited to the number of providers available. Maximum 15 - Mondays, Wednesdays & Thursdays Maximum 10 - Tuesdays & Fridays Services: You do not need to be a [facility] resident to be seen for a dental emergency. Emergencies are defined as pain, swelling, abnormal bleeding, or dental trauma. Walkins will receive x-rays if needed. NOTE: Dental cleaning is not an emergency. Payment Options: PAYMENT IS DUE AT THE TIME OF SERVICE. Minimum co-pay is $40.00 for uninsured patients. Minimum co-pay is $3.00 for Medicaid with dental coverage. Dental Insurance is accepted and must be presented at time of visit. Medicare does not cover dental. Forms of payment: Cash, credit card, checks. Best way to get seen: If not previously registered with the clinic, walk-in dental registration begins at 7:15 am and is on a first come/first serve basis. If previously registered with the clinic, call to make an appointment.     [facility] [phone] [place] RESIDENTS ONLY   Location: [address], [place], [place] Clinic Hours: Mon-Thu 10a-2p Services: Extractions only! Payment Options: FREE (donations accepted) - bring proof of income or support Best way to get seen: Call and schedule an appointment OR come at 8am on the 1st Monday of every month (except for holidays) when it is first come/first served.     [facility] [phone]   Location: [address] [place], [place] Clinic Hours: Friday mornings Services, Payment Options, Best way to get seen: Call for info

## 2023-12-25 NOTE — ED Provider Notes (Signed)
 [facility] Emergency Department Provider Note     Event Date/Time   First MD Initiated Contact with Patient 12/25/23 1734     (approximate)   History   Dental Pain   HPI  History limited by Pashto language.  Audio-phonic interpreter not presently available.  Family member at bedside providing HPI.  Kirsten Mccann is a 48 y.o. female with a history of HTN, DM type II, obesity, HLD, PTSD,, depression presents to the ED for evaluation of dental pain.  Patient would endorse discomfort to left upper maxilla secondary to dental pain with onset last night. She localizes pain to the upper left secondary incisor. No reports of  facial swelling or purulent drainage. She is also endorsing some discomfort to the ears bilaterally.  No reports of any fevers, chills, or sweats.  Patient with admittedly poor dentition, presents for evaluation of symptoms.  No difficulty breathing, swallowing, or controlling oral secretions.  Physical Exam   Triage Vital Signs: ED Triage Vitals  Encounter Vitals Group     BP 12/25/23 1727 (!) 145/94     Girls Systolic BP Percentile --      Girls Diastolic BP Percentile --      Boys Systolic BP Percentile --      Boys Diastolic BP Percentile --      Pulse Rate 12/25/23 1727 80     Resp 12/25/23 1727 20     Temp 12/25/23 1727 98.3 F (36.8 C)     Temp Source 12/25/23 1727 Oral     SpO2 12/25/23 1727 97 %     Weight 12/25/23 1728 200 lb (90.7 kg)     Height 12/25/23 1728 5' 4 (1.626 m)     Head Circumference --      Peak Flow --      Pain Score 12/25/23 1725 10     Pain Loc --      Pain Education --      Exclude from Growth Chart --     Most recent vital signs: Vitals:   12/25/23 1727  BP: (!) 145/94  Pulse: 80  Resp: 20  Temp: 98.3 F (36.8 C)  SpO2: 97%    General Awake, no distress. NAD HEENT NCAT. PERRL. EOMI. no facial swelling is appreciated.  Left upper secondary incisor (#10), with a chronic cavity defect  noted.  The lingual surface shows a defect of the enamel.  No focal gum swelling is appreciated.  No reported sublingual edema is noted.  Uvula is midline and tonsils are flat.  No rhinorrhea. Mucous membranes are moist.  CV:  Good peripheral perfusion.  RESP:  Normal effort.  ABD:  No distention.    ED Results / Procedures / Treatments   Labs (all labs ordered are listed, but only abnormal results are displayed) Labs Reviewed - No data to display   EKG   RADIOLOGY   No results found.   PROCEDURES:  Critical Care performed: No  Procedures   MEDICATIONS ORDERED IN ED: Medications  amoxicillin -clavulanate (AUGMENTIN ) 875-125 MG per tablet 1 tablet (1 tablet Oral Given 12/25/23 1800)  HYDROcodone -acetaminophen  (NORCO/VICODIN) 5-325 MG per tablet 1 tablet (1 tablet Oral Given 12/25/23 1800)     IMPRESSION / MDM / ASSESSMENT AND PLAN / ED COURSE  I reviewed the triage vital signs and the nursing notes.  Differential diagnosis includes, but is not limited to, dental caries, dental abscess, facial swelling, necrotizing gingivitis  Patient's presentation is most consistent with acute, uncomplicated illness.  Patient's diagnosis is consistent with dental pain secondary to dental caries.  Patient with no focal gum swelling of concern for dental abscess.  She is controlling oral secretions no difficulty.  Vital signs are stable and reassuring.  Patient is nontoxic in appearance, with normal speech and phonation.. Patient will be discharged home with prescriptions for Augmentin , and Norco (#9). Patient is to follow up with a local dental provider for definitive management as suggested, as needed or otherwise directed. Patient is given ED precautions to return to the ED for any worsening or new symptoms.   FINAL CLINICAL IMPRESSION(S) / ED DIAGNOSES   Final diagnoses:  Pain due to dental caries  Dental infection     Rx / DC Orders   ED Discharge  Orders          Ordered    HYDROcodone -acetaminophen  (NORCO/VICODIN) 5-325 MG tablet  3 times daily PRN        12/25/23 1757    amoxicillin -clavulanate (AUGMENTIN ) 875-125 MG tablet  2 times daily        12/25/23 1757             Note:  This document was prepared using Dragon voice recognition software and may include unintentional dictation errors.    Loyd Candida LULLA Aldona, PA-C 12/25/23 1816    Malvina Alm DASEN, MD 12/26/23 [phone]

## 2023-12-25 NOTE — ED Notes (Signed)
 Pt and pt's son verbalize understanding of discharge instructions

## 2023-12-26 ENCOUNTER — Emergency Department: Admission: EM | Admit: 2023-12-26 | Discharge: 2023-12-26 | Disposition: A | Discharge status: Home / Self Care

## 2023-12-26 ENCOUNTER — Other Ambulatory Visit: Payer: Self-pay

## 2023-12-26 DIAGNOSIS — R1013 Epigastric pain: Secondary | ICD-10-CM | POA: Insufficient documentation

## 2023-12-26 DIAGNOSIS — R110 Nausea: Secondary | ICD-10-CM | POA: Insufficient documentation

## 2023-12-26 DIAGNOSIS — E119 Type 2 diabetes mellitus without complications: Secondary | ICD-10-CM | POA: Insufficient documentation

## 2023-12-26 DIAGNOSIS — I10 Essential (primary) hypertension: Secondary | ICD-10-CM | POA: Diagnosis not present

## 2023-12-26 DIAGNOSIS — R519 Headache, unspecified: Secondary | ICD-10-CM | POA: Diagnosis not present

## 2023-12-26 LAB — URINALYSIS, ROUTINE W REFLEX MICROSCOPIC
Bacteria, UA: NONE SEEN
Bilirubin Urine: NEGATIVE
Glucose, UA: 50 mg/dL — AB
Ketones, ur: NEGATIVE mg/dL
Leukocytes,Ua: NEGATIVE
Nitrite: NEGATIVE
Protein, ur: NEGATIVE mg/dL
Specific Gravity, Urine: 1.019 (ref 1.005–1.030)
pH: 6 (ref 5.0–8.0)

## 2023-12-26 LAB — COMPREHENSIVE METABOLIC PANEL WITH GFR
ALT: 24 U/L (ref 0–44)
AST: 20 U/L (ref 15–41)
Albumin: 3.9 g/dL (ref 3.5–5.0)
Alkaline Phosphatase: 52 U/L (ref 38–126)
Anion gap: 11 (ref 5–15)
BUN: 11 mg/dL (ref 6–20)
CO2: 24 mmol/L (ref 22–32)
Calcium: 8.8 mg/dL — ABNORMAL LOW (ref 8.9–10.3)
Chloride: 101 mmol/L (ref 98–111)
Creatinine, Ser: 0.57 mg/dL (ref 0.44–1.00)
GFR, Estimated: >60 mL/min (ref >60)
Glucose, Bld: 192 mg/dL — ABNORMAL HIGH (ref 70–99)
Potassium: 4.1 mmol/L (ref 3.5–5.1)
Sodium: 136 mmol/L (ref 135–145)
Total Bilirubin: 0.7 mg/dL (ref 0.0–1.2)
Total Protein: 7.3 g/dL (ref 6.5–8.1)

## 2023-12-26 LAB — CBC
HCT: 37.5 % (ref 36.0–46.0)
Hemoglobin: 12.3 g/dL (ref 12.0–15.0)
MCH: 28.4 pg (ref 26.0–34.0)
MCHC: 32.8 g/dL (ref 30.0–36.0)
MCV: 86.6 fL (ref 80.0–100.0)
Platelets: 212 K/uL (ref 150–400)
RBC: 4.33 MIL/uL (ref 3.87–5.11)
RDW: 12.6 % (ref 11.5–15.5)
WBC: 6.7 K/uL (ref 4.0–10.5)
nRBC: 0 % (ref 0.0–0.2)

## 2023-12-26 LAB — HCG, QUANTITATIVE, PREGNANCY: hCG, Beta Chain, Quant, S: 1 m[IU]/mL (ref <5)

## 2023-12-26 LAB — LIPASE, BLOOD: Lipase: 26 U/L (ref 11–51)

## 2023-12-26 MED ORDER — ONDANSETRON 4 MG PO TBDP: 4.0000 mg | ORAL_TABLET | Freq: Three times a day (TID) | ORAL | 0 refills | Status: DC | PRN

## 2023-12-26 MED ORDER — ACETAMINOPHEN 500 MG PO TABS
1000.0000 mg | ORAL_TABLET | Freq: Once | ORAL | Status: AC
  Administered 2023-12-26: 1000 mg via ORAL
  Filled 2023-12-26: qty 2

## 2023-12-26 MED ORDER — MAALOX MAX 400-400-40 MG/5ML PO SUSP: 10.0000 mL | Freq: Four times a day (QID) | ORAL | 0 refills | Status: DC | PRN

## 2023-12-26 MED ORDER — FAMOTIDINE 20 MG PO TABS: 20.0000 mg | ORAL_TABLET | Freq: Two times a day (BID) | ORAL | 0 refills | Status: DC

## 2023-12-26 MED ORDER — ONDANSETRON 4 MG PO TBDP
4.0000 mg | ORAL_TABLET | Freq: Once | ORAL | Status: AC
  Administered 2023-12-26: 4 mg via ORAL
  Filled 2023-12-26: qty 1

## 2023-12-26 MED ORDER — ACETAMINOPHEN 500 MG PO TABS: 1000.0000 mg | ORAL_TABLET | Freq: Four times a day (QID) | ORAL | 2 refills | Status: DC | PRN

## 2023-12-26 MED ORDER — FAMOTIDINE 20 MG PO TABS
20.0000 mg | ORAL_TABLET | Freq: Once | ORAL | Status: AC
  Administered 2023-12-26: 20 mg via ORAL
  Filled 2023-12-26: qty 1

## 2023-12-26 MED ORDER — LIDOCAINE VISCOUS HCL 2 % MT SOLN
15.0000 mL | Freq: Once | OROMUCOSAL | Status: AC
  Administered 2023-12-26: 15 mL via ORAL
  Filled 2023-12-26: qty 15

## 2023-12-26 MED ORDER — ALUM & MAG HYDROXIDE-SIMETH 200-200-20 MG/5ML PO SUSP
30.0000 mL | Freq: Once | ORAL | Status: AC
  Administered 2023-12-26: 30 mL via ORAL
  Filled 2023-12-26: qty 30

## 2023-12-26 NOTE — ED Provider Notes (Signed)
 [facility] Provider Note    Event Date/Time   First MD Initiated Contact with Patient 12/26/23 [phone]     (approximate)   History   Abdominal Pain and Headache  Pt here with a headache and abd pain. Pt was seen here for dental pain on yesterday. Pt endorses NV.    Interpreter not available, family attempting to help interpret for pt.   HPI Kirsten Mccann is a 48 y.o. female PMH hypertension, T2DM, hyperlipidemia, PTSD, depression presents for evaluation of headache, abdominal discomfort -Per chart review, patient was seen in our emergency department yesterday complaining of dental pain of the upper left secondary incisor.  Reassuring evaluation.  Discharged on Augmentin  and Norco with plan for dental follow-up. - On my evaluation, patient states that she became nauseous yesterday evening and has vomited about 3 times.  No some upper abdominal discomfort.  No abdominal surgical history.  Does endorse some dysuria.  Separately complains of a headache, frontal.  No preceding trauma.  Denies any ongoing tooth pain.  Unfortunately no Pashto interpreter available, family member at bedside offering what appears to be very appropriate translation     Physical Exam   Triage Vital Signs: ED Triage Vitals  Encounter Vitals Group     BP 12/26/23 0823 139/83     Girls Systolic BP Percentile --      Girls Diastolic BP Percentile --      Boys Systolic BP Percentile --      Boys Diastolic BP Percentile --      Pulse Rate 12/26/23 0823 77     Resp 12/26/23 0823 17     Temp 12/26/23 0823 98.4 F (36.9 C)     Temp Source 12/26/23 0823 Oral     SpO2 12/26/23 0823 98 %     Weight 12/26/23 0825 199 lb 15.3 oz (90.7 kg)     Height 12/26/23 0825 5' 4 (1.626 m)     Head Circumference --      Peak Flow --      Pain Score 12/26/23 0823 9     Pain Loc --      Pain Education --      Exclude from Growth Chart --     Most recent vital signs: Vitals:   12/26/23 0823   BP: 139/83  Pulse: 77  Resp: 17  Temp: 98.4 F (36.9 C)  SpO2: 98%     General: Awake, no distress.  CV:  Good peripheral perfusion. RRR, RP 2+ Resp:  Normal effort. CTAB Abd:  No distention.  Endorses some mild tenderness to palpation in upper abdomen though no grimacing to deep palpation throughout.  No clear CVAT. Neuro = Aox4, CN II-XII intact, FNF wnl, finger taps fast b/l, 5/5 strength in bilateral finger extension/grip, arm flexion/extension, EHL/FHL. BUE AG 10+ sec no drift, BLE AG 5+ sec no drift. Ambulates with steady gait. SILT.      ED Results / Procedures / Treatments   Labs (all labs ordered are listed, but only abnormal results are displayed) Labs Reviewed  COMPREHENSIVE METABOLIC PANEL WITH GFR - Abnormal; Notable for the following components:      Result Value   Glucose, Bld 192 (*)    Calcium  8.8 (*)    All other components within normal limits  URINALYSIS, ROUTINE W REFLEX MICROSCOPIC - Abnormal; Notable for the following components:   Color, Urine YELLOW (*)    APPearance CLEAR (*)    Glucose, UA 50 (*)  Hgb urine dipstick SMALL (*)    All other components within normal limits  LIPASE, BLOOD  CBC  HCG, QUANTITATIVE, PREGNANCY  POC URINE PREG, ED     EKG  See ED course below.    RADIOLOGY N/a    PROCEDURES:  Critical Care performed: No  Procedures   MEDICATIONS ORDERED IN ED: Medications  ondansetron  (ZOFRAN -ODT) disintegrating tablet 4 mg (4 mg Oral Given 12/26/23 0908)  acetaminophen  (TYLENOL ) tablet 1,000 mg (1,000 mg Oral Given 12/26/23 0908)  alum & mag hydroxide-simeth (MAALOX/MYLANTA) 200-200-20 MG/5ML suspension 30 mL (30 mLs Oral Given 12/26/23 0908)    And  lidocaine  (XYLOCAINE ) 2 % viscous mouth solution 15 mL (15 mLs Oral Given 12/26/23 0908)  famotidine  (PEPCID ) tablet 20 mg (20 mg Oral Given 12/26/23 0908)     IMPRESSION / MDM / ASSESSMENT AND PLAN / ED COURSE  I reviewed the triage vital signs and the nursing  notes.                              DDX/MDM/AP: Differential diagnosis includes, but is not limited to, likely medication side effect secondary to Augmentin  or Norco that was prescribed yesterday for possible tooth infection, highly doubt acute intra-abdominal pathology given very benign exam here, nonfocal neurologic exam very well-appearing-do not suspect acute intracranial pathology at this time.  Consider viral syndrome, dyspepsia.  Consider underlying UTI.  Doubt ACS.  Plan: - Labs - Tylenol , Maalox/viscous lidocaine , famotidine , Zofran  -No indication for emergent imaging at this time - Reassess  Patient's presentation is most consistent with acute presentation with potential threat to life or bodily function.    ED course below.   Clinical Course as of 12/26/23 1041  Sun Dec 26, 2023  0843 Cbc wnl [MM]  [phone] CMP reviewed, unremarkable [MM]  [phone] Urinalysis reviewed, overall unremarkable [MM]  0938 Hcg neg [MM]  1000 Ecg = sinus rhythm, rate 71, no gross ST elevation or depression, no significant repolarization abnormality, normal axis, normal intervals.  No evidence of ischemia no arrhythmia on my interpretation. [MM]  1035 Reevaluated, serial abdominal exam benign.  Recheck temperature, normal.  Does complain of some ongoing nausea.  Clarifies that he took first doses of the recently prescribed Augmentin  and Norco on Friday evening but did not actually fill it at the pharmacy yet-counseled not to fill as I do not see any evidence of infection around her tooth on my reevaluation.  Presentation overall consistent with dyspepsia, possible viral syndrome, do not suspect acute underlying intra-abdominal pathology at this time.  No clinical concern for acute intracranial pathology either.  No evidence of anginal equivalent.  Will discharge home with Zofran , Maalox, famotidine  and plan for PMD follow-up.  ED return precautions in place.  Patient and family agree with plan. [MM]     Clinical Course User Index [MM] Clarine Ozell LABOR, MD     FINAL CLINICAL IMPRESSION(S) / ED DIAGNOSES   Final diagnoses:  Nausea  Epigastric discomfort  Nonintractable headache, unspecified chronicity pattern, unspecified headache type     Rx / DC Orders   ED Discharge Orders          Ordered    acetaminophen  (TYLENOL ) 500 MG tablet  Every 6 hours PRN        12/26/23 1039    alum & mag hydroxide-simeth (MAALOX MAX) 400-400-40 MG/5ML suspension  Every 6 hours PRN        12/26/23 1039  ondansetron  (ZOFRAN -ODT) 4 MG disintegrating tablet  Every 8 hours PRN        12/26/23 1039    famotidine  (PEPCID ) 20 MG tablet  2 times daily        12/26/23 1040             Note:  This document was prepared using Dragon voice recognition software and may include unintentional dictation errors.   Clarine Ozell LABOR, MD 12/26/23 1041

## 2023-12-26 NOTE — Discharge Instructions (Signed)
 Your evaluation in the emergency department was overall reassuring.  I suspect you may have a side effect of the medications that were given previously or you may have a viral illness-anticipate you should improve on your own within the next 1-2 days.  I prescribed you Tylenol , Maalox, a nausea medication (Zofran ), and an antacid medication (famotidine ) to help with your symptoms.  Please do follow-up with your primary care provider for reevaluation, and return to the emergency department with any new or worsening symptoms.

## 2023-12-26 NOTE — ED Triage Notes (Signed)
 Pt here with a headache and abd pain. Pt was seen here for dental pain on yesterday. Pt endorses NV.    Interpreter not available, family attempting to help interpret for pt.

## 2023-12-28 LAB — LIPID PANEL
Cholesterol: 124 (ref 0–200)
HDL: 32 — AB (ref 35–70)
LDL Cholesterol: 58
Triglycerides: 171 — AB (ref 40–160)

## 2023-12-28 LAB — BASIC METABOLIC PANEL WITH GFR: Sodium: 138 (ref 137–147)

## 2023-12-28 LAB — HEMOGLOBIN A1C: Hemoglobin A1C: 7.9

## 2024-01-06 ENCOUNTER — Ambulatory Visit: Admitting: Physician Assistant

## 2024-01-09 NOTE — Progress Notes (Unsigned)
 Established patient visit  Patient: Kirsten Mccann   DOB: Aug 18, 1975   48 y.o. Female  MRN: [id number] Visit Date: 01/10/2024  Today's healthcare provider: Jolynn Spencer, PA-C   No chief complaint on file.  Subjective       Discussed the use of AI scribe software for clinical note transcription with the patient, who gave verbal consent to proceed.  History of Present Illness Ty Polo is a 48 year old female who presents with persistent abdominal pain and associated symptoms. She is accompanied by her son, who assists with communication due to a language barrier.  She experiences a burning abdominal pain with any food intake, not relieved by omeprazole . Foods like rice, okra, spinach, yogurt, and chicken exacerbate the pain. She has not followed up with a gastroenterologist due to communication barriers.  She has daily headaches, diarrhea, excessive daytime sleepiness, frequent body shaking, and facial breakouts. She is not currently taking duloxetine , topiramate , or rizatriptan for her headaches.  Her diabetes is managed with glipizide , metformin , and Januvia , with an A1c of 7.9. There is a discrepancy between her usual diet and nutritional recommendations.  She experiences symptoms of depression and anxiety and has difficulty finding a psychologist covered by her insurance.  She has eczema with itching and breakouts, and her insurance does not cover the recommended dermatological injections.   Reevaluated, serial abdominal exam benign.  Recheck temperature, normal.  Does complain of some ongoing nausea.  Clarifies that he took first doses of the recently prescribed Augmentin  and Norco on Friday evening but did not actually fill it at the pharmacy yet-counseled not to fill as I do not see any evidence of infection around her tooth on my reevaluation.  Presentation overall consistent with dyspepsia, possible viral syndrome, do not suspect acute underlying intra-abdominal pathology at  this time.  No clinical concern for acute intracranial pathology either.  No evidence of anginal equivalent.   Will discharge home with Zofran , Maalox, famotidine  and plan for PMD follow-up.  ED       10/25/2023    5:09 PM 09/15/2022   10:30 AM  Depression screen PHQ 2/9  Decreased Interest  3  Down, Depressed, Hopeless  2  PHQ - 2 Score  5  Altered sleeping  2  Tired, decreased energy  1  Change in appetite  0  Feeling bad or failure about yourself   2  Trouble concentrating  0  Moving slowly or fidgety/restless  1  Suicidal thoughts  0  PHQ-9 Score  11  Difficult doing work/chores  Not difficult at all     Information is confidential and restricted. Go to Review Flowsheets to unlock data.      10/25/2023    5:09 PM 09/15/2022   10:43 AM  GAD 7 : Generalized Anxiety Score  Nervous, Anxious, on Edge  3  Control/stop worrying  3  Worry too much - different things  3  Trouble relaxing  0  Restless  3  Easily annoyed or irritable  3  Afraid - awful might happen  3  Total GAD 7 Score  18  Anxiety Difficulty  Somewhat difficult     Information is confidential and restricted. Go to Review Flowsheets to unlock data.    Medications: Outpatient Medications Prior to Visit  Medication Sig   acetaminophen  (TYLENOL ) 500 MG tablet Take 2 tablets (1,000 mg total) by mouth every 6 (six) hours as needed.   alum & mag hydroxide-simeth (MAALOX MAX) 400-400-40 MG/5ML suspension Take 10 mLs  by mouth every 6 (six) hours as needed for indigestion.   Blood Pressure Monitoring (BLOOD PRESSURE KIT) DEVI Use to measure BP daily (Patient not taking: Reported on 10/25/2023)   cetirizine  (ZYRTEC ) 10 MG tablet Take 1 tablet (10 mg total) by mouth daily. (Patient not taking: Reported on 10/25/2023)   DULoxetine  (CYMBALTA ) 20 MG capsule Take 2 capsules (40 mg total) by mouth daily. (Patient not taking: Reported on 10/25/2023)   famotidine  (PEPCID ) 20 MG tablet Take one tab po qd for stomach at lunch    famotidine  (PEPCID ) 20 MG tablet Take 1 tablet (20 mg total) by mouth 2 (two) times daily for 14 days.   ferrous sulfate 325 (65 FE) MG tablet Take by mouth. (Patient not taking: Reported on 10/25/2023)   glipiZIDE  (GLUCOTROL ) 5 MG tablet Take 1 tablet (5 mg total) by mouth daily before breakfast.   hydrOXYzine  (VISTARIL ) 25 MG capsule Take 1 capsule (25 mg total) by mouth 3 (three) times daily. (Patient not taking: Reported on 10/25/2023)   ibuprofen  (ADVIL ) 600 MG tablet Take 1 tablet (600 mg total) by mouth every 8 (eight) hours as needed for moderate pain. (Patient not taking: Reported on 10/25/2023)   ketoconazole  (NIZORAL ) 2 % shampoo Apply 1 Application topically 2 (two) times a week. Leave on for 5 minutes before rinsing off. (Patient not taking: Reported on 10/25/2023)   lidocaine  (XYLOCAINE ) 2 % solution Swish 5 mL in mouth, then spit out. Or, apply a total of 5 mL to the sore tooth with a cotton-tipped applicator or cotton ball. To not use more than every three hours. (Patient not taking: Reported on 10/25/2023)   linaclotide  (LINZESS ) 72 MCG capsule Take 1 capsule (72 mcg total) by mouth daily before breakfast. (Patient not taking: Reported on 10/25/2023)   meloxicam (MOBIC) 15 MG tablet Take 15 mg by mouth daily.   norethindrone  (AYGESTIN ) 5 MG tablet Take 1 tablet by mouth once daily   omeprazole  (PRILOSEC) 20 MG capsule Take 1 capsule (20 mg total) by mouth daily.   ondansetron  (ZOFRAN -ODT) 4 MG disintegrating tablet Take 1 tablet (4 mg total) by mouth every 8 (eight) hours as needed for nausea or vomiting.   rosuvastatin  (CRESTOR ) 10 MG tablet Take 1 tablet (10 mg total) by mouth daily.   silver sulfADIAZINE (SILVADENE) 1 % cream Apply topically. (Patient not taking: Reported on 10/25/2023)   sitaGLIPtin  (JANUVIA ) 100 MG tablet Take 1 tablet (100 mg total) by mouth daily. (Patient not taking: Reported on 10/25/2023)   tacrolimus  (PROTOPIC ) 0.1 % ointment Apply 2 grams twice daily to arms, legs  and back (Patient not taking: Reported on 10/25/2023)   topiramate  (TOPAMAX ) 25 MG tablet Take one tab po qhs for headaches (Patient not taking: Reported on 10/25/2023)   topiramate  (TOPAMAX ) 50 MG tablet Take 1 tablet by mouth 2 (two) times daily. (Patient not taking: Reported on 10/25/2023)   triamcinolone  ointment (KENALOG ) 0.1 % Apply twice daily to affected areas until smooth. Avoid applying to face, groin, and axilla. Use as directed. Long-term use can cause thinning of the skin. (Patient not taking: Reported on 10/25/2023)   No facility-administered medications prior to visit.    Review of Systems  All other systems reviewed and are negative.  All negative Except see HPI   {Insert previous labs (optional):23779} {See past labs  Heme  Chem  Endocrine  Serology  Results Review (optional):1}   Objective    LMP 11/27/2023 (Approximate)  {Insert last BP/Wt (optional):23777}{See vitals history (optional):1}  Physical Exam Vitals reviewed.  Constitutional:      General: She is not in acute distress.    Appearance: Normal appearance. She is well-developed. She is not diaphoretic.  HENT:     Head: Normocephalic and atraumatic.  Eyes:     General: No scleral icterus.    Conjunctiva/sclera: Conjunctivae normal.  Neck:     Thyroid: No thyromegaly.  Cardiovascular:     Rate and Rhythm: Normal rate and regular rhythm.     Pulses: Normal pulses.     Heart sounds: Normal heart sounds. No murmur heard. Pulmonary:     Effort: Pulmonary effort is normal. No respiratory distress.     Breath sounds: Normal breath sounds. No wheezing, rhonchi or rales.  Musculoskeletal:     Cervical back: Neck supple.     Right lower leg: No edema.     Left lower leg: No edema.  Lymphadenopathy:     Cervical: No cervical adenopathy.  Skin:    General: Skin is warm and dry.     Findings: No rash.  Neurological:     Mental Status: She is alert and oriented to person, place, and time. Mental status  is at baseline.  Psychiatric:        Mood and Affect: Mood normal.        Behavior: Behavior normal.      No results found for any visits on 01/10/24.      Assessment and Plan Assessment & Plan Chronic Abdominal Pain Chronic abdominal pain with burning sensation, likely GERD or other GI pathology. Omeprazole  ineffective at current dose. Referral to gastroenterology needed. - Increase omeprazole  to twice daily. - Refer to gastroenterologist for further evaluation and imaging. - Ensure correct contact information for scheduling with gastroenterologist. - Advise avoidance of acidic foods. - Encourage eating until 80% full.  Headaches Recurrent headaches with sleepiness and body shaking. Previous neurology consultation with duloxetine , topiramate , and rizatriptan prescribed but not taken. Follow-up with neurology needed. - Refer to neurologist for follow-up and reassessment. - Ensure correct contact information for scheduling with neurologist. - Discuss potential benefits of restarting duloxetine  with neurologist.  Diabetes Mellitus Diabetes with A1c of 7.9, indicating suboptimal control. Current medications include glipizide , metformin , and Januvia . Dietary modifications needed. - Continue glipizide , metformin , and Januvia . - Advise avoidance of high sugar foods. - Encourage follow-up with endocrinologist if needed.  Eczema Eczema with itching and breakouts. Insurance does not cover recommended injections. Recent dermatology consultation. - Contact dermatologist to discuss treatment plan and sample availability. - Ensure correct contact information for scheduling with dermatologist.  Depression and Anxiety Ongoing depression and anxiety. Previous prescription of duloxetine  not taken. Recent contact with therapist at clinic. - Encourage follow-up with therapist at clinic. - Discuss potential benefits of restarting duloxetine  with neurologist.  General Health Maintenance Has  not seen a gynecologist recently. Possible perimenopause. Urine test and lab work needed. - Order urine test and lab work. - Refer to gynecologist for evaluation of perimenopausal symptoms.  Follow-up Follow-up needed to monitor progress and adjust treatment plans. - Schedule follow-up appointment in three months. - Ensure scheduling of appointments with gastroenterologist, neurologist, and gynecologist.    No orders of the defined types were placed in this encounter.   No follow-ups on file.   The patient was advised to call back or seek an in-person evaluation if the symptoms worsen or if the condition fails to improve as anticipated.  I discussed the assessment and treatment plan with the patient. The patient  was provided an opportunity to ask questions and all were answered. The patient agreed with the plan and demonstrated an understanding of the instructions.  I, Pamala Hayman, PA-C have reviewed all documentation for this visit. The documentation on 01/10/2024  for the exam, diagnosis, procedures, and orders are all accurate and complete.  Jolynn Spencer, [facility], MMS [facility] [phone] (phone) [phone] (fax)  [facility]

## 2024-01-10 ENCOUNTER — Ambulatory Visit (INDEPENDENT_AMBULATORY_CARE_PROVIDER_SITE_OTHER): Admitting: Physician Assistant

## 2024-01-10 ENCOUNTER — Encounter: Payer: Self-pay | Admitting: Physician Assistant

## 2024-01-10 VITALS — BP 136/84 | HR 76 | Resp 14 | Ht 64.0 in | Wt 184.8 lb

## 2024-01-10 DIAGNOSIS — Z559 Problems related to education and literacy, unspecified: Secondary | ICD-10-CM | POA: Diagnosis not present

## 2024-01-10 DIAGNOSIS — E0859 Diabetes mellitus due to underlying condition with other circulatory complications: Secondary | ICD-10-CM

## 2024-01-10 DIAGNOSIS — F32A Depression, unspecified: Secondary | ICD-10-CM

## 2024-01-10 DIAGNOSIS — I152 Hypertension secondary to endocrine disorders: Secondary | ICD-10-CM | POA: Diagnosis not present

## 2024-01-10 DIAGNOSIS — E66811 Obesity, class 1: Secondary | ICD-10-CM

## 2024-01-10 DIAGNOSIS — Z7984 Long term (current) use of oral hypoglycemic drugs: Secondary | ICD-10-CM

## 2024-01-10 DIAGNOSIS — E785 Hyperlipidemia, unspecified: Secondary | ICD-10-CM

## 2024-01-10 DIAGNOSIS — R21 Rash and other nonspecific skin eruption: Secondary | ICD-10-CM | POA: Diagnosis not present

## 2024-01-10 DIAGNOSIS — Z758 Other problems related to medical facilities and other health care: Secondary | ICD-10-CM

## 2024-01-10 DIAGNOSIS — F419 Anxiety disorder, unspecified: Secondary | ICD-10-CM

## 2024-01-10 DIAGNOSIS — Z603 Acculturation difficulty: Secondary | ICD-10-CM | POA: Diagnosis not present

## 2024-01-10 DIAGNOSIS — E1169 Type 2 diabetes mellitus with other specified complication: Secondary | ICD-10-CM | POA: Diagnosis not present

## 2024-01-10 DIAGNOSIS — G8929 Other chronic pain: Secondary | ICD-10-CM

## 2024-01-10 DIAGNOSIS — G4489 Other headache syndrome: Secondary | ICD-10-CM

## 2024-01-10 DIAGNOSIS — R109 Unspecified abdominal pain: Secondary | ICD-10-CM

## 2024-01-12 DIAGNOSIS — R21 Rash and other nonspecific skin eruption: Secondary | ICD-10-CM | POA: Insufficient documentation

## 2024-01-12 DIAGNOSIS — G8929 Other chronic pain: Secondary | ICD-10-CM | POA: Insufficient documentation

## 2024-01-12 DIAGNOSIS — G4489 Other headache syndrome: Secondary | ICD-10-CM | POA: Insufficient documentation

## 2024-01-12 DIAGNOSIS — F32A Depression, unspecified: Secondary | ICD-10-CM | POA: Insufficient documentation

## 2024-01-13 ENCOUNTER — Encounter: Payer: Self-pay | Admitting: Psychiatry

## 2024-01-13 ENCOUNTER — Ambulatory Visit (INDEPENDENT_AMBULATORY_CARE_PROVIDER_SITE_OTHER): Admitting: Psychiatry

## 2024-01-13 VITALS — BP 126/85 | HR 70 | Temp 98.1°F | Ht 61.0 in | Wt 182.0 lb

## 2024-01-13 DIAGNOSIS — F4310 Post-traumatic stress disorder, unspecified: Secondary | ICD-10-CM | POA: Diagnosis not present

## 2024-01-13 DIAGNOSIS — F09 Unspecified mental disorder due to known physiological condition: Secondary | ICD-10-CM | POA: Diagnosis not present

## 2024-01-13 DIAGNOSIS — Z79899 Other long term (current) drug therapy: Secondary | ICD-10-CM | POA: Diagnosis not present

## 2024-01-13 DIAGNOSIS — F321 Major depressive disorder, single episode, moderate: Secondary | ICD-10-CM

## 2024-01-13 MED ORDER — DULOXETINE HCL 30 MG PO CPEP: 30.0000 mg | ORAL_CAPSULE | Freq: Every day | ORAL | 1 refills | Status: DC

## 2024-01-13 NOTE — Progress Notes (Unsigned)
 [facility] MD OP Progress Note  01/13/2024 3:07 PM Kirsten Mccann  MRN:  [id number]  Chief Complaint:  Chief Complaint  Patient presents with   Follow-up   Depression   Anxiety   Medication Problem   HPI: Kirsten Mccann is a 48 year old Asian female originally from [place], married, unemployed, lives in [place] with her family, has a history of PTSD, depression, cognitive disorder, hyperlipidemia, diabetes mellitus, hypertension was evaluated in office today presented for a follow-up. Pashto interpreter was made use of for this appointment today.  Patient being a limited historian majority of information was obtained from son who was present in session.  Patient appeared to be oriented to self, situation however could not give any details about her date of birth, her address, date or medications.  She continues to struggle with excessive sleepiness, sadness, crying spells and memory changes.  There has been no change to her mood symptoms since her last visit on 10/25/2023.  She continues to miss her children who are left behind in [place] and that has been traumatic to her.  She has intrusive memories and nightmares about it.  She continues to struggle with aches and pains all over her body and spends a lot of time in bed.  She was prescribed duloxetine  by a neurologist a year ago however she was never able to pick up that prescription.  She is interested in trial of duloxetine  today.  She currently denies any suicidality, homicidality or perceptual disturbances.  She is motivated to stay in psychotherapy and has access to a therapist at her family practice provider's office.  She had primary care appointment recently.  Denies any other concerns today.  Visit Diagnosis:    ICD-10-CM   1. PTSD (post-traumatic stress disorder)  F43.10 DULoxetine  (CYMBALTA ) 30 MG capsule    Vitamin B12    VITAMIN D 25 Hydroxy (Vit-D Deficiency, Fractures)    2. Current moderate episode of major depressive  disorder without prior episode (HCC)  F32.1 DULoxetine  (CYMBALTA ) 30 MG capsule    Vitamin B12    VITAMIN D 25 Hydroxy (Vit-D Deficiency, Fractures)    3. Cognitive disorder  F09 Vitamin B12    VITAMIN D 25 Hydroxy (Vit-D Deficiency, Fractures)   likely mild    4. High risk medication use  Z79.899 Vitamin B12    VITAMIN D 25 Hydroxy (Vit-D Deficiency, Fractures)      Past Psychiatric History: I have reviewed past psychiatric history from progress note on 10/25/2023.  Past Medical History:  Past Medical History:  Diagnosis Date   Anxiety    Depression    Diabetes (HCC)    Diabetes (HCC)    GERD (gastroesophageal reflux disease)    HLD    Hyperlipidemia     Past Surgical History:  Procedure Laterality Date   NO PAST SURGERIES      Family Psychiatric History: I have reviewed family psychiatric history from progress note on 10/25/2023.  Family History:  Family History  Problem Relation Age of Onset   Mental illness Neg Hx     Social History: I have reviewed social history from progress note on 10/25/2023. Social History   Socioeconomic History   Marital status: Married    Spouse name: Not on file   Number of children: Not on file   Years of education: none   Highest education level: Not on file  Occupational History   Not on file  Tobacco Use   Smoking status: Never   Smokeless tobacco: Never  Vaping Use   Vaping status: Never Used  Substance and Sexual Activity   Alcohol use: Never   Drug use: Never   Sexual activity: Yes    Birth control/protection: None  Other Topics Concern   Not on file  Social History Narrative   Not on file   Social Drivers of Health   Financial Resource Strain: Not on file  Food Insecurity: Not on file  Transportation Needs: Not on file  Physical Activity: Not on file  Stress: Not on file  Social Connections: Not on file    Allergies: No Known Allergies  Metabolic Disorder Labs: Lab Results  Component Value Date    HGBA1C 7.9 12/28/2023   No results found for: PROLACTIN Lab Results  Component Value Date   CHOL 124 12/28/2023   TRIG 171 (A) 12/28/2023   HDL 32 (A) 12/28/2023   CHOLHDL 7.8 (H) 06/29/2023   LDLCALC 58 12/28/2023   LDLCALC 127 (H) 06/29/2023   Lab Results  Component Value Date   TSH 0.479 02/04/2023   TSH 3.17 07/24/2022    Therapeutic Level Labs: No results found for: LITHIUM No results found for: VALPROATE No results found for: CBMZ  Current Medications: Current Outpatient Medications  Medication Sig Dispense Refill   DULoxetine  (CYMBALTA ) 30 MG capsule Take 1 capsule (30 mg total) by mouth daily with supper. 30 capsule 1   famotidine  (PEPCID ) 20 MG tablet Take one tab po qd for stomach at lunch 90 tablet 3   glipiZIDE  (GLUCOTROL ) 5 MG tablet Take 1 tablet (5 mg total) by mouth daily before breakfast. 90 tablet 3   metFORMIN  (GLUCOPHAGE ) 500 MG tablet Take 500 mg by mouth. (Patient taking differently: Take 500 mg by mouth 2 (two) times daily with a meal.)     omeprazole  (PRILOSEC) 20 MG capsule Take 1 capsule (20 mg total) by mouth daily. 90 capsule 3   rosuvastatin  (CRESTOR ) 10 MG tablet Take 1 tablet (10 mg total) by mouth daily. 90 tablet 3   sitaGLIPtin  (JANUVIA ) 100 MG tablet Take 1 tablet (100 mg total) by mouth daily. 90 tablet 3   Blood Pressure Monitoring (BLOOD PRESSURE KIT) DEVI Use to measure BP daily 1 each 0   HYDROcodone -acetaminophen  (NORCO/VICODIN) 5-325 MG tablet Take 1 tablet by mouth 3 (three) times daily as needed. (Patient not taking: Reported on 01/13/2024)     norethindrone  (AYGESTIN ) 5 MG tablet Take 1 tablet by mouth once daily 90 tablet 3   No current facility-administered medications for this visit.     Musculoskeletal: Strength & Muscle Tone: within normal limits Gait & Station: normal Patient leans: N/A  Psychiatric Specialty Exam: Review of Systems  Psychiatric/Behavioral:  Positive for dysphoric mood and sleep disturbance. The  patient is nervous/anxious.     Blood pressure 126/85, pulse 70, temperature 98.1 F (36.7 C), temperature source Temporal, height 5' 1 (1.549 m), weight 182 lb (82.6 kg), last menstrual period 11/27/2023, SpO2 97%.Body mass index is 34.39 kg/m.  General Appearance: Casual  Eye Contact:  Good  Speech:  Normal Rate  Volume:  Normal  Mood:  Anxious and Depressed  Affect:  Restricted  Thought Process:  Goal Directed and Descriptions of Associations: Intact  Orientation:  Other:  Self, situation  Thought Content: Logical   Suicidal Thoughts:  No  Homicidal Thoughts:  No  Memory:  Immediate;   Fair Recent;   Poor Remote;   Poor  Judgement:  Fair  Insight:  Shallow  Psychomotor Activity:  Normal  Concentration:  Concentration: Poor and Attention Span: Poor  Recall:  Poor  Fund of Knowledge: Poor  Language: Fair  Akathisia:  No  Handed:  Right  AIMS (if indicated): not done  Assets:  Housing Social Support  ADL's:  Intact  Cognition: UTA language barrier likely limited  Sleep:  Excessive   Screenings: GAD-7    Flowsheet Row Office Visit from 10/25/2023 in [facility] Office Visit from 09/15/2022 in [facility]  Total GAD-7 Score 17 18   PHQ2-9    Flowsheet Row Office Visit from 10/25/2023 in [facility] Office Visit from 09/15/2022 in [facility]  PHQ-2 Total Score 1 5  PHQ-9 Total Score 12 11   Flowsheet Row Office Visit from 01/13/2024 in [facility] ED from 12/26/2023 in [facility] Department at [facility] ED from 12/25/2023 in [facility] Emergency Department at [facility]  C-SSRS RISK CATEGORY No Risk No Risk No Risk     Assessment and Plan: Kirsten Mccann is a 48 year old Asian female, presents with worsening mood symptoms, PTSD, was evaluated in office today, discussed  assessment and plan as noted below.  MDD-unstable PTSD-unstable Patient with significant depression, trauma related symptoms, exhibits low mood, excessive sleepiness, cognitive issues complicated by language barrier.  Also reports generalized body aches likely also contributed by her depression symptoms, agreeable to trial of duloxetine . Start Duloxetine  30 mg daily with supper Encouraged to establish care with therapist, has access through primary care provider's office.  Cognitive disorder unspecified-unstable Patient with ongoing memory problems, encouraged to follow up with neurology. Patient was previously referred to neurology.  Patient to discuss with primary care provider as well.  High risk medication use Will order vitamin B12, vitamin D. Lab slip provided.  Collateral information obtained from son who was present in session who is agreeable to assist patient with taking her medications regularly.  Follow-up Follow-up in clinic in 8 weeks or sooner if needed.    Collaboration of Care: Collaboration of Care: Referral or follow-up with counselor/therapist AEB encouraged to establish care with therapist.  Encouraged to follow up with neurology.  Patient/Guardian was advised Release of Information must be obtained prior to any record release in order to collaborate their care with an outside provider. Patient/Guardian was advised if they have not already done so to contact the registration department to sign all necessary forms in order for us  to release information regarding their care.   Consent: Patient/Guardian gives verbal consent for treatment and assignment of benefits for services provided during this visit. Patient/Guardian expressed understanding and agreed to proceed.   This note was generated in part or whole with voice recognition software. Voice recognition is usually quite accurate but there are transcription errors that can and very often do occur. I apologize for  any typographical errors that were not detected and corrected.    Kirsten Osorno, MD 01/14/2024, 8:07 AM

## 2024-01-13 NOTE — Patient Instructions (Signed)
 Duloxetine Delayed-Release Capsules What is this medication? DULOXETINE (doo LOX e teen) treats depression, anxiety, fibromyalgia, and certain types of chronic pain such as nerve, bone, or joint pain. It increases the amount of serotonin and norepinephrine in the brain, hormones that help regulate mood and pain. It belongs to a group of medications called SNRIs. This medicine may be used for other purposes; ask your health care provider or pharmacist if you have questions. COMMON BRAND NAME(S): Cymbalta, Drizalma, Irenka What should I tell my care team before I take this medication? They need to know if you have any of these conditions: Bipolar disorder Glaucoma High blood pressure Kidney disease Liver disease Seizures Suicidal thoughts, plans, or attempt by you or a family member Take medications that treat or prevent blood clots Taken an MAOI, such as Carbex, Eldepryl, Marplan, Nardil, or Parnate in the last 14 days Trouble passing urine An unusual reaction to duloxetine, other medications, foods, dyes, or preservatives Pregnant or trying to get pregnant Breastfeeding How should I use this medication? Take this medication by mouth with water. Take it as directed on the prescription label at the same time every day. Do not cut, crush, or chew this medication. Swallow the capsules whole. Some capsules may be opened and sprinkled on applesauce. Check with your care team or pharmacist if you are not sure. You can take this medication with or without food. Do not take your medication more often than directed. Do not stop taking this medication suddenly except upon the advice of your care team. Stopping this medication too quickly may cause serious side effects or your condition may worsen. A special MedGuide will be given to you by the pharmacist with each prescription and refill. Be sure to read this information carefully each time. Talk to your care team about the use of this medication in  children. While it may be prescribed for children as young as 7 years for selected conditions, precautions do apply. Overdosage: If you think you have taken too much of this medicine contact a poison control center or emergency room at once. NOTE: This medicine is only for you. Do not share this medicine with others. What if I miss a dose? If you miss a dose, take it as soon as you can. If it is almost time for your next dose, take only that dose. Do not take double or extra doses. What may interact with this medication? Do not take this medication with any of the following: Desvenlafaxine Levomilnacipran Linezolid MAOIs, such as Carbex, Eldepryl, Emsam, Marplan, Nardil, and Parnate Methylene blue (injected into a vein) Milnacipran Safinamide Thioridazine Venlafaxine Viloxazine This medication may also interact with the following: Alcohol Amphetamines Aspirin and aspirin-like medications Certain antibiotics, such as ciprofloxacin and enoxacin Certain medications for blood pressure, heart disease, irregular heart beat Certain medications for mental health conditions Certain medications for migraine headache, such as almotriptan, eletriptan, frovatriptan, naratriptan, rizatriptan, sumatriptan, zolmitriptan Certain medications that treat or prevent blood clots, such as warfarin, enoxaparin, and dalteparin Cimetidine Fentanyl Lithium NSAIDS, medications for pain and inflammation, such as ibuprofen or naproxen Phentermine Procarbazine Rasagiline Sibutramine St. John's Wort Theophylline Tramadol Tryptophan This list may not describe all possible interactions. Give your health care provider a list of all the medicines, herbs, non-prescription drugs, or dietary supplements you use. Also tell them if you smoke, drink alcohol, or use illegal drugs. Some items may interact with your medicine. What should I watch for while using this medication? Tell your care team if your  symptoms do not  get better or if they get worse. Visit your care team for regular checks on your progress. Because it may take several weeks to see the full effects of this medication, it is important to continue your treatment as prescribed by your care team. This medication may cause serious skin reactions. They can happen weeks to months after starting the medication. Contact your care team right away if you notice fevers or flu-like symptoms with a rash. The rash may be red or purple and then turn into blisters or peeling of the skin. You may also notice a red rash with swelling of the face, lips, or lymph nodes in your neck or under your arms. Watch for new or worsening thoughts of suicide or depression. This includes sudden changes in mood, behaviors, or thoughts. These changes can happen at any time but are more common in the beginning of treatment or after a change in dose. Call your care team right away if you experience these thoughts or worsening depression. This medication may cause mood and behavior changes, such as anxiety, nervousness, irritability, hostility, restlessness, excitability, hyperactivity, or trouble sleeping. These changes can happen at any time but are more common in the beginning of treatment or after a change in dose. Call your care team right away if you notice any of these symptoms. This medication may affect your coordination, reaction time, or judgment. Do not drive or operate machinery until you know how this medication affects you. Sit up or stand slowly to reduce the risk of dizzy or fainting spells. Drinking alcohol with this medication can increase the risk of these side effects. This medication may increase blood sugar. The risk may be higher in patients who already have diabetes. Ask your care team what you can do to lower your risk of diabetes while taking this medication. This medication can cause an increase in blood pressure. This medication can also cause a sudden drop in your  blood pressure, which may make you feel faint and increase the chance of a fall. These effects are most common when you first start the medication or when the dose is increased, or during use of other medications that can cause a sudden drop in blood pressure. Check with your care team for instructions on monitoring your blood pressure while taking this medication. Your mouth may get dry. Chewing sugarless gum or sucking hard candy and drinking plenty of water may help. Contact your care team if the problem does not go away or is severe. What side effects may I notice from receiving this medication? Side effects that you should report to your care team as soon as possible: Allergic reactions--skin rash, itching, hives, swelling of the face, lips, tongue, or throat Bleeding--bloody or black, tar-like stools, red or dark brown urine, vomiting blood or brown material that looks like coffee grounds, small, red or purple spots on skin, unusual bleeding or bruising Increase in blood pressure Liver injury--right upper belly pain, loss of appetite, nausea, light-colored stool, dark yellow or brown urine, yellowing skin or eyes, unusual weakness or fatigue Low sodium level--muscle weakness, fatigue, dizziness, headache, confusion Redness, blistering, peeling, or loosening of the skin, including inside the mouth Serotonin syndrome--irritability, confusion, fast or irregular heartbeat, muscle stiffness, twitching muscles, sweating, high fever, seizures, chills, vomiting, diarrhea Sudden eye pain or change in vision such as blurry vision, seeing halos around lights, vision loss Thoughts of suicide or self-harm, worsening mood, feelings of depression Trouble passing urine Side effects that  usually do not require medical attention (report to your care team if they continue or are bothersome): Change in sex drive or performance Constipation Diarrhea Dizziness Dry mouth Excessive sweating Loss of  appetite Nausea Vomiting This list may not describe all possible side effects. Call your doctor for medical advice about side effects. You may report side effects to FDA at 1-800-FDA-1088. Where should I keep my medication? Keep out of the reach of children and pets. Store at room temperature between 15 and 30 degrees C (59 to 86 degrees F). Get rid of any unused medication after the expiration date. To get rid of medications that are no longer needed or have expired: Take the medication to a medication take-back program. Check with your pharmacy or law enforcement to find a location. If you cannot return the medication, check the label or package insert to see if the medication should be thrown out in the garbage or flushed down the toilet. If you are not sure, ask your care team. If it is safe to put it in the trash, take the medication out of the container. Mix the medication with cat litter, dirt, coffee grounds, or other unwanted substance. Seal the mixture in a bag or container. Put it in the trash. NOTE: This sheet is a summary. It may not cover all possible information. If you have questions about this medicine, talk to your doctor, pharmacist, or health care provider.  2024 Elsevier/Gold Standard (2022-03-05 00:00:00)

## 2024-01-14 ENCOUNTER — Other Ambulatory Visit
Admission: RE | Admit: 2024-01-14 | Discharge: 2024-01-14 | Disposition: A | Discharge status: Home / Self Care | Source: Ambulatory Visit | Attending: Psychiatry | Admitting: Psychiatry

## 2024-01-14 ENCOUNTER — Telehealth (INDEPENDENT_AMBULATORY_CARE_PROVIDER_SITE_OTHER): Payer: Self-pay

## 2024-01-14 DIAGNOSIS — Z5181 Encounter for therapeutic drug level monitoring: Secondary | ICD-10-CM | POA: Diagnosis not present

## 2024-01-14 DIAGNOSIS — G3184 Mild cognitive impairment, so stated: Secondary | ICD-10-CM | POA: Insufficient documentation

## 2024-01-14 DIAGNOSIS — F329 Major depressive disorder, single episode, unspecified: Secondary | ICD-10-CM | POA: Insufficient documentation

## 2024-01-14 DIAGNOSIS — R109 Unspecified abdominal pain: Secondary | ICD-10-CM

## 2024-01-14 DIAGNOSIS — F4310 Post-traumatic stress disorder, unspecified: Secondary | ICD-10-CM | POA: Diagnosis present

## 2024-01-14 DIAGNOSIS — G8929 Other chronic pain: Secondary | ICD-10-CM

## 2024-01-14 LAB — VITAMIN B12: Vitamin B-12: 190 pg/mL (ref 180–914)

## 2024-01-14 LAB — VITAMIN D 25 HYDROXY (VIT D DEFICIENCY, FRACTURES): Vit D, 25-Hydroxy: 13.57 ng/mL — ABNORMAL LOW (ref 30–100)

## 2024-01-14 NOTE — Telephone Encounter (Signed)
 Patient dropped off a urine in a non-sterile container. Pt has been contacted if she can come by office to pick up a sterile cup and bring back Monday unless she able to urinate when she arrives

## 2024-01-16 ENCOUNTER — Ambulatory Visit: Payer: Self-pay | Admitting: Psychiatry

## 2024-01-16 NOTE — Telephone Encounter (Signed)
 Could you please contact the patient regarding her recent blood test results? Her vitamin D  level is low, so I'd recommend she start taking over the counter vitamin D3 800 to 1000 IU daily. Her vitamin B12 level is within the normal range, so no action is needed this time. Thanks.

## 2024-01-17 LAB — POCT URINALYSIS DIPSTICK
Bilirubin, UA: NEGATIVE
Blood, UA: NEGATIVE
Glucose, UA: NEGATIVE
Ketones, UA: NEGATIVE
Leukocytes, UA: NEGATIVE
Nitrite, UA: NEGATIVE
Protein, UA: POSITIVE — AB
Spec Grav, UA: 1.015 (ref 1.010–1.025)
Urobilinogen, UA: 0.2 U/dL
pH, UA: 6 (ref 5.0–8.0)

## 2024-01-17 NOTE — Progress Notes (Signed)
Called patient to inform of lab results no answer left voicemail for patient to return call to office

## 2024-01-17 NOTE — Progress Notes (Signed)
 Patient returned call to office inform her and her son of the lab results and advised patient of message from provider to start over the counter vitamin D3 [phone] daily patient and son voiced understanding

## 2024-01-17 NOTE — Telephone Encounter (Signed)
 Made 2nd attempt to reach patient son answered phone and stated that he was outside asked again if I could speak to patient he stated again that he was outside left a message for patient to return call to office

## 2024-01-19 ENCOUNTER — Other Ambulatory Visit: Payer: Self-pay | Admitting: Physician Assistant

## 2024-01-19 ENCOUNTER — Ambulatory Visit: Payer: Self-pay | Admitting: Physician Assistant

## 2024-01-19 DIAGNOSIS — N390 Urinary tract infection, site not specified: Secondary | ICD-10-CM

## 2024-01-19 LAB — URINE CULTURE

## 2024-01-19 LAB — SPECIMEN STATUS REPORT

## 2024-01-19 MED ORDER — CEPHALEXIN 500 MG PO CAPS: 500.0000 mg | ORAL_CAPSULE | Freq: Two times a day (BID) | ORAL | 0 refills | Status: DC

## 2024-01-19 MED ORDER — FLUCONAZOLE 150 MG PO TABS: 150.0000 mg | ORAL_TABLET | Freq: Once | ORAL | 0 refills | Status: AC

## 2024-01-19 NOTE — Progress Notes (Signed)
 Needs translator to/from Pashto Please, let pt know that she has Beta hemolytic strep/UTI and two medications were sent to her pharmacy. She needs to drink plenty of water, take it with meals and yogurt

## 2024-01-24 NOTE — Progress Notes (Signed)
Called patient to inform of message no answer left voicemail for patient to return call to office

## 2024-01-24 NOTE — Progress Notes (Signed)
 Son returned call to office inform him of the message from provider he voiced understanding

## 2024-02-02 ENCOUNTER — Encounter: Payer: Self-pay | Admitting: Certified Nurse Midwife

## 2024-02-02 ENCOUNTER — Ambulatory Visit (INDEPENDENT_AMBULATORY_CARE_PROVIDER_SITE_OTHER): Admitting: Certified Nurse Midwife

## 2024-02-02 VITALS — BP 119/70 | HR 89 | Resp 16 | Ht 64.0 in | Wt 178.2 lb

## 2024-02-02 DIAGNOSIS — R109 Unspecified abdominal pain: Secondary | ICD-10-CM | POA: Diagnosis not present

## 2024-02-02 DIAGNOSIS — G8929 Other chronic pain: Secondary | ICD-10-CM | POA: Diagnosis not present

## 2024-02-02 DIAGNOSIS — R102 Pelvic and perineal pain: Secondary | ICD-10-CM

## 2024-02-02 DIAGNOSIS — N814 Uterovaginal prolapse, unspecified: Secondary | ICD-10-CM | POA: Diagnosis not present

## 2024-02-02 NOTE — Progress Notes (Signed)
    GYNECOLOGY PROGRESS NOTE  Subjective:    Patient ID: Kirsten Mccann, female    DOB: [birth year], 48 y.o.   MRN: [id number]  HPI  Patient is a 48 y.o. G89P0 female who presents for lower abdominal pain since the birth of her last child 12 years ago but it has gotten a lot worse in the past 2 years.  She describes pain as aching. The pain is worse when she is on her cycle. She also has very heavy bleeding.   She take Tylenol  for her headaches, but it gives her no relief for her abdominal pain.   She also notes some frequent urination. She endorses urinary incontinence when she coughs, sneezes or laughs.   She has a history of 15 pregnancies. She carried 12 pregnancies to full term and delivered vaginally. She has no history of uterine or ovarian surgery.   She and her family are refugees from [place]. They do not have transportation (she walked to her appointment today) and have limited access to a telephone.    The following portions of the patient's history were reviewed and updated as appropriate: allergies, current medications, past family history, past medical history, past social history, past surgical history, and problem list.  Review of Systems Pertinent items noted in HPI and remainder of comprehensive ROS otherwise negative.   Objective:   Blood pressure 119/70, pulse 89, resp. rate 16, height 5' 4 (1.626 m), weight 178 lb 3.2 oz (80.8 kg), last menstrual period 02/01/2024. Body mass index is 30.59 kg/m. General appearance: alert, cooperative, and no distress Abdomen: suprapubic tenderness, no masses, uterine or ovarian enlargement Pelvic: external genitalia normal, no adnexal masses or tenderness, no cervical motion tenderness, and cervix prolapsed at introitus     Assessment:   1. Pelvic pain   2. Uterovaginal prolapse   3. Chronic abdominal pain      Plan:   1) Uterovaginal prolapse -Referral sent to pelvic PT  2)Chronic abdominal pain - TVUS to assess  for structural issues, ovarian cysts, uterine fibroids or other pathology.  -Plan based on results. Kirsten Mccann is open to IUD insertion if needed for symptom management. She would need a longer discussion about the risk of benefits of an IUD.   RTC in 3 months to assess progress.     Damien Parsley, CNM [facility]

## 2024-02-02 NOTE — Assessment & Plan Note (Signed)
 TVUS to assess for pelvic pain

## 2024-02-21 ENCOUNTER — Ambulatory Visit (INDEPENDENT_AMBULATORY_CARE_PROVIDER_SITE_OTHER): Admitting: Dermatology

## 2024-02-21 ENCOUNTER — Encounter: Payer: Self-pay | Admitting: Dermatology

## 2024-02-21 DIAGNOSIS — Z79899 Other long term (current) drug therapy: Secondary | ICD-10-CM | POA: Diagnosis not present

## 2024-02-21 DIAGNOSIS — Z7189 Other specified counseling: Secondary | ICD-10-CM

## 2024-02-21 DIAGNOSIS — L209 Atopic dermatitis, unspecified: Secondary | ICD-10-CM

## 2024-02-21 MED ORDER — DUPIXENT 300 MG/2ML ~~LOC~~ SOAJ: 300.0000 mg | SUBCUTANEOUS | 5 refills | Status: DC

## 2024-02-21 MED ORDER — DUPILUMAB 300 MG/2ML ~~LOC~~ SOSY
600.0000 mg | PREFILLED_SYRINGE | SUBCUTANEOUS | Status: DC
  Administered 2024-02-21 – 2024-03-06 (×2): 600 mg via SUBCUTANEOUS

## 2024-02-21 NOTE — Patient Instructions (Addendum)
 Discussed Dupixent  injection and side effects. Dupilumab  (Dupixent ) is a treatment given by injection for adults and children with moderate-to-severe atopic dermatitis. Goal is control of skin condition, not cure. It is given as 2 injections at the first dose followed by 1 injection ever 2 weeks thereafter.     Potential side effects include allergic reaction, herpes infections, injection site reactions and conjunctivitis (inflammation of the eyes).  The use of Dupixent  requires long term medication management, including periodic office visits.   Gentle Skin Care Guide  1. Bathe no more than once a day.  2. Avoid bathing in hot water  3. Use a mild soap like Dove, Vanicream, Cetaphil, CeraVe. Can use Lever 2000 or Cetaphil antibacterial soap  4. Use soap only where you need it. On most days, use it under your arms, between your legs, and on your feet. Let the water rinse other areas unless visibly dirty.  5. When you get out of the bath/shower, use a towel to gently blot your skin dry, don't rub it.  6. While your skin is still a little damp, apply a moisturizing cream such as Vanicream, CeraVe, Cetaphil, Eucerin, Sarna lotion or plain Vaseline Jelly. For hands apply Neutrogena [place] Hand Cream or Excipial Hand Cream.  7. Reapply moisturizer any time you start to itch or feel dry.  8. Sometimes using free and clear laundry detergents can be helpful. Fabric softener sheets should be avoided. Downy Free & Gentle liquid, or any liquid fabric softener that is free of dyes and perfumes, it acceptable to use  9. If your doctor has given you prescription creams you may apply moisturizers over them       Due to recent changes in healthcare laws, you may see results of your pathology and/or laboratory studies on MyChart before the doctors have had a chance to review them. We understand that in some cases there may be results that are confusing or concerning to you. Please understand that not  all results are received at the same time and often the doctors may need to interpret multiple results in order to provide you with the best plan of care or course of treatment. Therefore, we ask that you please give us  2 business days to thoroughly review all your results before contacting the office for clarification. Should we see a critical lab result, you will be contacted sooner.   If You Need Anything After Your Visit  If you have any questions or concerns for your doctor, please call our main line at [phone] and press option 4 to reach your doctor's medical assistant. If no one answers, please leave a voicemail as directed and we will return your call as soon as possible. Messages left after 4 pm will be answered the following business day.   You may also send us  a message via MyChart. We typically respond to MyChart messages within 1-2 business days.  For prescription refills, please ask your pharmacy to contact our office. Our fax number is [phone].  If you have an urgent issue when the clinic is closed that cannot wait until the next business day, you can page your doctor at the number below.    Please note that while we do our best to be available for urgent issues outside of office hours, we are not available 24/7.   If you have an urgent issue and are unable to reach us , you may choose to seek medical care at your doctor's office, retail clinic, urgent care center,  or emergency room.  If you have a medical emergency, please immediately call 911 or go to the emergency department.  Pager Numbers  - Dr. Hester: [phone]  - Dr. Jackquline: [phone]  - Dr. Claudene: [phone]   - Dr. Raymund: [phone]  In the event of inclement weather, please call our main line at [phone] for an update on the status of any delays or closures.  Dermatology Medication Tips: Please keep the boxes that topical medications come in in order to help keep track of the  instructions about where and how to use these. Pharmacies typically print the medication instructions only on the boxes and not directly on the medication tubes.   If your medication is too expensive, please contact our office at [phone] option 4 or send us  a message through MyChart.   We are unable to tell what your co-pay for medications will be in advance as this is different depending on your insurance coverage. However, we may be able to find a substitute medication at lower cost or fill out paperwork to get insurance to cover a needed medication.   If a prior authorization is required to get your medication covered by your insurance company, please allow us  1-2 business days to complete this process.  Drug prices often vary depending on where the prescription is filled and some pharmacies may offer cheaper prices.  The website www.goodrx.com contains coupons for medications through different pharmacies. The prices here do not account for what the cost may be with help from insurance (it may be cheaper with your insurance), but the website can give you the price if you did not use any insurance.  - You can print the associated coupon and take it with your prescription to the pharmacy.  - You may also stop by our office during regular business hours and pick up a GoodRx coupon card.  - If you need your prescription sent electronically to a different pharmacy, notify our office through [facility] or by phone at [phone] option 4.     Si Usted Necesita Algo Despus de Su Visita  Tambin puede enviarnos un mensaje a travs de Clinical cytogeneticist. Por lo general respondemos a los mensajes de MyChart en el transcurso de 1 a 2 das hbiles.  Para renovar recetas, por favor pida a su farmacia que se ponga en contacto con nuestra oficina. Randi lakes de fax es [place] [phone].  Si tiene un asunto urgente cuando la clnica est cerrada y que no puede esperar hasta el siguiente da hbil,  puede llamar/localizar a su doctor(a) al nmero que aparece a continuacin.   Por favor, tenga en cuenta que aunque hacemos todo lo posible para estar disponibles para asuntos urgentes fuera del horario de [place], no estamos disponibles las 24 horas del da, los 7 [address]  [address] de la [place].   Si tiene un problema urgente y no puede comunicarse con nosotros, puede optar por buscar atencin mdica  en el consultorio de su doctor(a), en una clnica privada, en un centro de atencin urgente o en una sala de emergencias.  Si tiene Engineer, drilling, por favor llame inmediatamente al 911 o vaya a la sala de emergencias.  Nmeros de bper  - Dr. Hester: [phone]  - Dra. Jackquline: [phone]  - Dr. Claudene: [phone]  - Dra. Kitts: [phone]  En caso de inclemencias del [place], por favor llame a nuestra lnea principal al [phone] para una actualizacin sobre el estado de cualquier retraso o cierre.  Consejos para la  medicacin en dermatologa: Por favor, guarde las cajas en las que vienen los medicamentos de uso tpico para ayudarle a seguir las instrucciones sobre dnde y cmo usarlos. Las farmacias generalmente imprimen las instrucciones del medicamento slo en las cajas y no directamente en los tubos del [place].   Si su medicamento es muy caro, por favor, pngase en contacto con landry rieger llamando al [phone] y presione la opcin 4 o envenos un mensaje a travs de Clinical cytogeneticist.   No podemos decirle cul ser su copago por los medicamentos por adelantado ya que esto es diferente dependiendo de la cobertura de su seguro. Sin embargo, es posible que podamos encontrar un medicamento sustituto a Audiological scientist un formulario para que el seguro cubra el medicamento que se considera necesario.   Si se requiere una autorizacin previa para que su compaa de seguros malta su medicamento, por favor permtanos de 1 a 2 das hbiles para completar este proceso.  Los precios  de los medicamentos varan con frecuencia dependiendo del Environmental consultant de dnde se surte la receta y alguna farmacias pueden ofrecer precios ms baratos.  El sitio web www.goodrx.com tiene cupones para medicamentos de Health and safety inspector. Los precios aqu no tienen en cuenta lo que podra costar con la ayuda del seguro (puede ser ms barato con su seguro), pero el sitio web puede darle el precio si no utiliz Tourist information centre manager.  - Puede imprimir el cupn correspondiente y llevarlo con su receta a la farmacia.  - Tambin puede pasar por nuestra oficina durante el horario de atencin regular y Education officer, museum una tarjeta de cupones de GoodRx.  - Si necesita que su receta se enve electrnicamente a una farmacia diferente, informe a nuestra oficina a travs de MyChart de [place] o por telfono llamando al [phone] y presione la opcin 4.

## 2024-02-21 NOTE — Progress Notes (Signed)
   Follow Up Visit   Subjective  Kirsten Mccann is a 48 y.o. female who presents for the following: itching all over the body. Dur: ~1 year. No new medications prior to itching. Has been using Ketoconazole  shampoo and Tacrolimus  ointment. This has not helped. She would like to start Dupixent  injection. States she is suffering a lot from this condition.   This patient is accompanied in the office by her son.   The following portions of the chart were reviewed this encounter and updated as appropriate: medications, allergies, medical history  Review of Systems:  No other skin or systemic complaints except as noted in HPI or Assessment and Plan.  Objective  Well appearing patient in no apparent distress; mood and affect are within normal limits.  A focused examination was performed of the following areas: Scattered body  Relevant exam findings are noted in the Assessment and Plan.    Assessment & Plan   ATOPIC DERMATITIS, failed clobetasol and tacrolimus  Exam: Scaly pink papules coalescing to plaques on extremities 5-10% BSA  Chronic and persistent condition with duration or expected duration over one year. Condition is bothersome/symptomatic for patient. Currently flared.   Atopic dermatitis (eczema) is a chronic, relapsing, pruritic condition that can significantly affect quality of life. It is often associated with allergic rhinitis and/or asthma and can require treatment with topical medications, phototherapy, or in severe cases biologic injectable medication (Dupixent ; Adbry) or Oral JAK inhibitors.  Treatment Plan: Jointly decided to start Dupixent . Start 600 mg then 300 mg every 14 days Dupixent  300 mg/2 ml x2, loading dose given today. B/L upper arm.  NDC: 9975-4085-97 Lot: [id number] Exp: 10/2025  Patient tolerated injection well.   Discussed Dupixent  injection and side effects. Dupilumab  (Dupixent ) is a treatment given by injection for adults and children with  moderate-to-severe atopic dermatitis. Goal is control of skin condition, not cure. It is given as 2 injections at the first dose followed by 1 injection ever 2 weeks thereafter.     Potential side effects include allergic reaction, herpes infections, injection site reactions and conjunctivitis (inflammation of the eyes).  The use of Dupixent  requires long term medication management, including periodic office visits.  Patient states she will not be able to give herself injections at home and does not have anyone at home who can  help her. She states she will return here every 2 weeks for injections.  Patient states she does not have latent TB even though chart lists this. Discussed that Dupixent  should be safe even if she does but stop if symptoms develop Recommend gentle skin care.   ATOPIC DERMATITIS, UNSPECIFIED TYPE   Related Medications dupilumab  (DUPIXENT ) prefilled syringe 600 mg  Dupilumab  (DUPIXENT ) 300 MG/2ML SOAJ Inject 300 mg into the skin every 14 (fourteen) days. COUNSELING AND COORDINATION OF CARE   MEDICATION MANAGEMENT   LONG-TERM USE OF HIGH-RISK MEDICATION    Return in about 2 weeks (around 03/06/2024) for Injection On Nurse Schedule, 2-3 months with Dr. Claudene.  I, Jill Parcell, CMA, am acting as scribe for Boneta Claudene, MD.   Documentation: I have reviewed the above documentation for accuracy and completeness, and I agree with the above.  Boneta Claudene, MD

## 2024-03-03 ENCOUNTER — Ambulatory Visit

## 2024-03-03 DIAGNOSIS — R102 Pelvic and perineal pain: Secondary | ICD-10-CM | POA: Diagnosis not present

## 2024-03-06 ENCOUNTER — Ambulatory Visit (INDEPENDENT_AMBULATORY_CARE_PROVIDER_SITE_OTHER)

## 2024-03-06 DIAGNOSIS — L209 Atopic dermatitis, unspecified: Secondary | ICD-10-CM

## 2024-03-06 NOTE — Progress Notes (Signed)
 Patient here today for Dupixent  injection for severe atopic dermatitis. Patient accompanied by her son.  Dupixent  300mg /23mL injected into right upper arm. Patient tolerated injection well.  LOT: [id number] EXP: 12/12/2025  Alan Pizza, RMA

## 2024-03-07 ENCOUNTER — Emergency Department
Admission: EM | Admit: 2024-03-07 | Discharge: 2024-03-08 | Disposition: A | Discharge status: Home / Self Care | Attending: Emergency Medicine | Admitting: Emergency Medicine

## 2024-03-07 ENCOUNTER — Other Ambulatory Visit: Payer: Self-pay

## 2024-03-07 DIAGNOSIS — R21 Rash and other nonspecific skin eruption: Secondary | ICD-10-CM | POA: Diagnosis present

## 2024-03-07 NOTE — ED Triage Notes (Signed)
 Pt reports she was seen yesterday by dr for rash and itching and she was given an injection, pt reports today she developed burning in her armpits.

## 2024-03-07 NOTE — ED Provider Notes (Signed)
 [facility] Provider Note    Event Date/Time   First MD Initiated Contact with Patient 03/07/24 2259     (approximate)   History   Rash   HPI  Kirsten Mccann is a 48 y.o. female history of atopic dermatitis presenting with rash to her armpits.  States not pruritic, burning in nature.  No intraoral lesions, no fever, denies similar rash anywhere else.  Denies any drainage.  States that she was given a shot to her arm on the 22nd.  And noticed the rash to her armpit Tuesday afternoon.  States she feels like the eczema to her arms are improving.   On independent chart review, she was seen by dermatology on the eighth for her severe eczema, they have decided to proceed with biologic injections, she was seen on the 22nd for her injection to the right upper arm.   Unable to get Pashto interpreter at this time, son is willing to translate.  Physical Exam   Triage Vital Signs: ED Triage Vitals  Encounter Vitals Group     BP 03/07/24 2242 (!) 155/82     Girls Systolic BP Percentile --      Girls Diastolic BP Percentile --      Boys Systolic BP Percentile --      Boys Diastolic BP Percentile --      Pulse Rate 03/07/24 2242 66     Resp 03/07/24 2242 17     Temp 03/07/24 2242 98.8 F (37.1 C)     Temp src --      SpO2 03/07/24 2242 98 %     Weight 03/07/24 2241 190 lb (86.2 kg)     Height 03/07/24 2241 5' 6 (1.676 m)     Head Circumference --      Peak Flow --      Pain Score 03/07/24 2241 6     Pain Loc --      Pain Education --      Exclude from Growth Chart --     Most recent vital signs: Vitals:   03/07/24 2242  BP: (!) 155/82  Pulse: 66  Resp: 17  Temp: 98.8 F (37.1 C)  SpO2: 98%     General: Awake, no distress.  CV:  Good peripheral perfusion.  Resp:  Normal effort.  Abd:  No distention.  Soft nontender Other:  Erythematous rash to bilateral armpits, does not appear targetoid, no fluctuance, no bullae, negative Nikolsky sign, it is  blanching.  No intraoral lesions, she does have eczematous rash to her bilateral forearms.   ED Results / Procedures / Treatments   Labs (all labs ordered are listed, but only abnormal results are displayed) Labs Reviewed - No data to display   PROCEDURES:  Critical Care performed: No  Procedures   MEDICATIONS ORDERED IN ED: Medications - No data to display   IMPRESSION / MDM / ASSESSMENT AND PLAN / ED COURSE  I reviewed the triage vital signs and the nursing notes.                              Differential diagnosis includes, but is not limited to, suspect reaction to Biologics, does not appear to be consistent with cellulitis given bilateral rash to her armpits.  No fluctuance to suspect abscess.  Rash does not appear targetoid, negative Nikolsky, no intraoral lesions, doubt Stevens-Johnson's.  Discussed with patient and son about following up  with dermatology tomorrow and to call the office tomorrow to be seen.  She can take Tylenol  as needed for pain.  Considered but no indication for inpatient mission at this time, she safe for outpatient management.  Will discharge with strict turn precautions.  Shared decision making to patient and son and they are agreeable with this plan.  Will discharge with return precautions.  Patient's presentation is most consistent with acute illness / injury with system symptoms.         FINAL CLINICAL IMPRESSION(S) / ED DIAGNOSES   Final diagnoses:  Rash     Rx / DC Orders   ED Discharge Orders     None        Note:  This document was prepared using Dragon voice recognition software and may include unintentional dictation errors.    Waymond Lorelle Cummins, MD 03/08/24 [phone]

## 2024-03-08 ENCOUNTER — Ambulatory Visit (INDEPENDENT_AMBULATORY_CARE_PROVIDER_SITE_OTHER): Admitting: Dermatology

## 2024-03-08 ENCOUNTER — Encounter: Payer: Self-pay | Admitting: Dermatology

## 2024-03-08 DIAGNOSIS — L308 Other specified dermatitis: Secondary | ICD-10-CM | POA: Diagnosis not present

## 2024-03-08 DIAGNOSIS — T50905A Adverse effect of unspecified drugs, medicaments and biological substances, initial encounter: Secondary | ICD-10-CM | POA: Diagnosis not present

## 2024-03-08 DIAGNOSIS — L309 Dermatitis, unspecified: Secondary | ICD-10-CM

## 2024-03-08 MED ORDER — TRIAMCINOLONE ACETONIDE 0.1 % EX CREA: TOPICAL_CREAM | CUTANEOUS | 0 refills | Status: DC

## 2024-03-08 MED ORDER — PREDNISONE 10 MG PO TABS: ORAL_TABLET | ORAL | 0 refills | Status: AC

## 2024-03-08 NOTE — Discharge Instructions (Signed)
 Call your dermatologist office in the morning to set up a follow-up appointment.  You can take 650 of Tylenol  as needed every 6 hours for pain.  If the rash progresses, starts to bubble up or appears like the skin is falling off, if you have any intraoral lesions, any fever, or if you have any additional concerns, please return to be seen.

## 2024-03-08 NOTE — Progress Notes (Addendum)
   Follow-Up Visit   Subjective  Kirsten Mccann is a 48 y.o. female who presents for the following: Patient c/o a burning sensation on her arms and body aches started 1 day ago, patient  was given a Dupixent  injection her 2 days ago for eczema, patient went to the ER last night and was told to come here for treatment of burning sensation from Dupixent  injection. No sores or pain in mouth. No eye discomfort  Son is with patient and contributes to history and interpreted.   The following portions of the chart were reviewed this encounter and updated as appropriate: medications, allergies, medical history  Review of Systems:  No other skin or systemic complaints except as noted in HPI or Assessment and Plan.  Objective  Well appearing patient in no apparent distress; mood and affect are within normal limits.   A focused examination was performed of the following areas: Face,arms   Relevant exam findings are noted in the Assessment and Plan.    Assessment & Plan    DRUG REACTION, eczematous Exam: Scaly pink papules and/or plaques dorsal hands, wrists, axillae, legs  Treatment Plan: Labs ordered CBC with diff, CMP  Start triamcinolone  cream apply to affected skin qd-bid prn, stop once the rash is clear   Topical steroids (such as triamcinolone , fluocinolone, fluocinonide, mometasone, clobetasol, halobetasol, betamethasone, hydrocortisone) can cause thinning and lightening of the skin if they are used for too long in the same area. Your physician has selected the right strength medicine for your problem and area affected on the body. Please use your medication only as directed by your physician to prevent side effects.    Start Prednisone  30 mg for 2 days, then 20 mg for 2 days, then 10 mg for 2 days   Risks of prednisone  taper include mood irritability, insomnia, weight gain, stomach ulcers, increased risk of infection, increased blood sugar (diabetes), hypertension, osteoporosis with  long-term or frequent use, and rare risk of avascular necrosis of the hip. Patient will monitor blood pressure and blood sugar at home ADVERSE EFFECT OF DRUG, INITIAL ENCOUNTER   Related Procedures Comprehensive metabolic panel with GFR CBC with Differential/Platelets Related Medications predniSONE  (DELTASONE ) 10 MG tablet Take 3 tablets (30 mg total) by mouth daily for 2 days, THEN 2 tablets (20 mg total) daily for 2 days, THEN 1 tablet (10 mg total) daily for 2 days. triamcinolone  cream (KENALOG ) 0.1 % Apply to rash, itchy areas on body twice a day once the rash is clear stop, Avoid applying to face and groin.  Use as directed. Long-term use can cause thinning of the skin. OTHER ECZEMA   Related Procedures Comprehensive metabolic panel with GFR CBC with Differential/Platelets Related Medications predniSONE  (DELTASONE ) 10 MG tablet Take 3 tablets (30 mg total) by mouth daily for 2 days, THEN 2 tablets (20 mg total) daily for 2 days, THEN 1 tablet (10 mg total) daily for 2 days.  Return in about 1 week (around 03/15/2024) for drug reaction.  IFay Kirks, CMA, am acting as scribe for Boneta Sharps, MD .   Documentation: I have reviewed the above documentation for accuracy and completeness, and I agree with the above.  Boneta Sharps, MD

## 2024-03-08 NOTE — Patient Instructions (Signed)

## 2024-03-09 ENCOUNTER — Ambulatory Visit: Payer: Self-pay | Admitting: Dermatology

## 2024-03-09 LAB — CBC WITH DIFFERENTIAL/PLATELET
Basophils Absolute: 0 x10E3/uL (ref 0.0–0.2)
Basos: 1 %
EOS (ABSOLUTE): 0.1 x10E3/uL (ref 0.0–0.4)
Eos: 1 %
Hematocrit: 39 % (ref 34.0–46.6)
Hemoglobin: 12.6 g/dL (ref 11.1–15.9)
Immature Grans (Abs): 0 x10E3/uL (ref 0.0–0.1)
Immature Granulocytes: 0 %
Lymphocytes Absolute: 2.5 x10E3/uL (ref 0.7–3.1)
Lymphs: 39 %
MCH: 28.1 pg (ref 26.6–33.0)
MCHC: 32.3 g/dL (ref 31.5–35.7)
MCV: 87 fL (ref 79–97)
Monocytes Absolute: 0.4 x10E3/uL (ref 0.1–0.9)
Monocytes: 7 %
Neutrophils Absolute: 3.2 x10E3/uL (ref 1.4–7.0)
Neutrophils: 52 %
Platelets: 236 x10E3/uL (ref 150–450)
RBC: 4.49 x10E6/uL (ref 3.77–5.28)
RDW: 12.6 % (ref 11.7–15.4)
WBC: 6.2 x10E3/uL (ref 3.4–10.8)

## 2024-03-09 LAB — COMPREHENSIVE METABOLIC PANEL WITH GFR
ALT: 18 IU/L (ref 0–32)
AST: 16 IU/L (ref 0–40)
Albumin: 4.4 g/dL (ref 3.9–4.9)
Alkaline Phosphatase: 65 IU/L (ref 41–116)
BUN/Creatinine Ratio: 18 (ref 9–23)
BUN: 14 mg/dL (ref 6–24)
Bilirubin Total: 0.4 mg/dL (ref 0.0–1.2)
CO2: 21 mmol/L (ref 20–29)
Calcium: 9.4 mg/dL (ref 8.7–10.2)
Chloride: 106 mmol/L (ref 96–106)
Creatinine, Ser: 0.77 mg/dL (ref 0.57–1.00)
Globulin, Total: 3.1 g/dL (ref 1.5–4.5)
Glucose: 102 mg/dL — ABNORMAL HIGH (ref 70–99)
Potassium: 4.1 mmol/L (ref 3.5–5.2)
Sodium: 140 mmol/L (ref 134–144)
Total Protein: 7.5 g/dL (ref 6.0–8.5)
eGFR: 96 mL/min/1.73 (ref >59)

## 2024-03-09 NOTE — Telephone Encounter (Signed)
 Spoke to patient's son, advised of patient's results and voiced understanding to all.

## 2024-03-09 NOTE — Telephone Encounter (Signed)
-----   Message from [place] sent at 03/09/2024  8:44 AM EDT ----- Please call patient's son to share that blood work is normal. Normal blood counts, liver, kidney, electrolytes. Thank you ----- Message ----- From: Rebecka Memos Lab Results In Sent: 03/09/2024   7:36 AM EDT To: Boneta Sharps, MD

## 2024-03-14 ENCOUNTER — Ambulatory Visit: Admitting: Psychiatry

## 2024-03-16 ENCOUNTER — Ambulatory Visit: Admitting: Dermatology

## 2024-03-16 ENCOUNTER — Other Ambulatory Visit: Payer: Self-pay | Admitting: Psychiatry

## 2024-03-16 DIAGNOSIS — F4310 Post-traumatic stress disorder, unspecified: Secondary | ICD-10-CM

## 2024-03-16 DIAGNOSIS — F321 Major depressive disorder, single episode, moderate: Secondary | ICD-10-CM

## 2024-03-21 ENCOUNTER — Ambulatory Visit

## 2024-03-23 ENCOUNTER — Ambulatory Visit (INDEPENDENT_AMBULATORY_CARE_PROVIDER_SITE_OTHER): Admitting: Dermatology

## 2024-03-23 ENCOUNTER — Other Ambulatory Visit: Payer: Self-pay | Admitting: Psychiatry

## 2024-03-23 ENCOUNTER — Other Ambulatory Visit: Payer: Self-pay | Admitting: Dermatology

## 2024-03-23 ENCOUNTER — Encounter: Payer: Self-pay | Admitting: Dermatology

## 2024-03-23 DIAGNOSIS — F4310 Post-traumatic stress disorder, unspecified: Secondary | ICD-10-CM

## 2024-03-23 DIAGNOSIS — F321 Major depressive disorder, single episode, moderate: Secondary | ICD-10-CM

## 2024-03-23 DIAGNOSIS — D22122 Melanocytic nevi of left lower eyelid, including canthus: Secondary | ICD-10-CM

## 2024-03-23 DIAGNOSIS — D229 Melanocytic nevi, unspecified: Secondary | ICD-10-CM

## 2024-03-23 DIAGNOSIS — L739 Follicular disorder, unspecified: Secondary | ICD-10-CM

## 2024-03-23 DIAGNOSIS — L209 Atopic dermatitis, unspecified: Secondary | ICD-10-CM

## 2024-03-23 MED ORDER — CLOBETASOL PROPIONATE 0.05 % EX CREA: TOPICAL_CREAM | CUTANEOUS | 2 refills | Status: DC

## 2024-03-23 NOTE — Progress Notes (Signed)
   Follow-Up Visit   Subjective  Kirsten Mccann is a 48 y.o. female who presents for the following: Patient c/o a burning sensation on her arms and body aches started 1 day ago, patient  was given a Dupixent  injection her 2 days ago for eczema, patient went to the ER last night and was told to come here for treatment of burning sensation from Dupixent  injection. No sores or pain in mouth. No eye discomfort  Son is with patient and contributes to history and interpreted.   The following portions of the chart were reviewed this encounter and updated as appropriate: medications, allergies, medical history  Review of Systems:  No other skin or systemic complaints except as noted in HPI or Assessment and Plan.  Objective  Well appearing patient in no apparent distress; mood and affect are within normal limits.   A focused examination was performed of the following areas: Face,arms   Relevant exam findings are noted in the Assessment and Plan.    Assessment & Plan    DRUG REACTION, eczematous, resolved Exam: clear  Treatment Plan:  Patient did not take prednisone . Reaction has cleared  Topical steroids (such as triamcinolone , fluocinolone, fluocinonide, mometasone, clobetasol, halobetasol, betamethasone, hydrocortisone) can cause thinning and lightening of the skin if they are used for too long in the same area. Your physician has selected the right strength medicine for your problem and area affected on the body. Please use your medication only as directed by your physician to prevent side effects.    ATOPIC DERMATITIS, failed dupilumab  due to drug reaction Exam: Scaly pink papules coalescing to plaques and excoriations on hands forearms lower legs. Thin lichenified plaques dorsal feet 5% BSA  Chronic and persistent condition with duration or expected duration over one year. Condition is symptomatic/ bothersome to patient. Not currently at goal.   Atopic dermatitis (eczema) is a  chronic, relapsing, pruritic condition that can significantly affect quality of life. It is often associated with allergic rhinitis and/or asthma and can require treatment with topical medications, phototherapy, or in severe cases biologic injectable medication (Dupixent ; Adbry) or Oral JAK inhibitors.  Treatment Plan: Start clobetasol BID until smooth Consider cibinqo but would need CXR for hx of latent TB and labs Recommend gentle skin care.   MELANOCYTIC NEVUS Exam: brown symmetric papule at left lateral lower eyelid  Treatment Plan: Benign appearing on exam today. Recommend observation. Call clinic for new or changing moles. Recommend daily use of broad spectrum spf 30+ sunscreen to sun-exposed areas.   ATOPIC DERMATITIS, UNSPECIFIED TYPE   Related Medications dupilumab  (DUPIXENT ) prefilled syringe 600 mg  MULTIPLE BENIGN NEVI   FOLLICULITIS    Return in about 1 month (around 04/23/2024) for Atopic Dermatitis Follow Up.    Documentation: I have reviewed the above documentation for accuracy and completeness, and I agree with the above.  Boneta Sharps, MD

## 2024-03-23 NOTE — Patient Instructions (Signed)
 Start Clobetasol cream twice daily to affected areas on body until smooth/resolved. Avoid applying to face, groin, and axilla. Use as directed. Long-term use can cause thinning of the skin.   Topical steroids (such as triamcinolone , fluocinolone, fluocinonide, mometasone, clobetasol, halobetasol, betamethasone, hydrocortisone) can cause thinning and lightening of the skin if they are used for too long in the same area. Your physician has selected the right strength medicine for your problem and area affected on the body. Please use your medication only as directed by your physician to prevent side effects.    Gentle Skin Care Guide  1. Bathe no more than once a day.  2. Avoid bathing in hot water  3. Use a mild soap like Dove, Vanicream, Cetaphil, CeraVe. Can use Lever 2000 or Cetaphil antibacterial soap  4. Use soap only where you need it. On most days, use it under your arms, between your legs, and on your feet. Let the water rinse other areas unless visibly dirty.  5. When you get out of the bath/shower, use a towel to gently blot your skin dry, don't rub it.  6. While your skin is still a little damp, apply a moisturizing cream such as Vanicream, CeraVe, Cetaphil, Eucerin, Sarna lotion or plain Vaseline Jelly. For hands apply Neutrogena [place] Hand Cream or Excipial Hand Cream.  7. Reapply moisturizer any time you start to itch or feel dry.  8. Sometimes using free and clear laundry detergents can be helpful. Fabric softener sheets should be avoided. Downy Free & Gentle liquid, or any liquid fabric softener that is free of dyes and perfumes, it acceptable to use  9. If your doctor has given you prescription creams you may apply moisturizers over them       Due to recent changes in healthcare laws, you may see results of your pathology and/or laboratory studies on MyChart before the doctors have had a chance to review them. We understand that in some cases there may be results  that are confusing or concerning to you. Please understand that not all results are received at the same time and often the doctors may need to interpret multiple results in order to provide you with the best plan of care or course of treatment. Therefore, we ask that you please give us  2 business days to thoroughly review all your results before contacting the office for clarification. Should we see a critical lab result, you will be contacted sooner.   If You Need Anything After Your Visit  If you have any questions or concerns for your doctor, please call our main line at [phone] and press option 4 to reach your doctor's medical assistant. If no one answers, please leave a voicemail as directed and we will return your call as soon as possible. Messages left after 4 pm will be answered the following business day.   You may also send us  a message via MyChart. We typically respond to MyChart messages within 1-2 business days.  For prescription refills, please ask your pharmacy to contact our office. Our fax number is [phone].  If you have an urgent issue when the clinic is closed that cannot wait until the next business day, you can page your doctor at the number below.    Please note that while we do our best to be available for urgent issues outside of office hours, we are not available 24/7.   If you have an urgent issue and are unable to reach us , you may choose to  seek medical care at your doctor's office, retail clinic, urgent care center, or emergency room.  If you have a medical emergency, please immediately call 911 or go to the emergency department.  Pager Numbers  - Dr. Hester: [phone]  - Dr. Jackquline: [phone]  - Dr. Claudene: [phone]   - Dr. Raymund: [phone]  In the event of inclement weather, please call our main line at [phone] for an update on the status of any delays or closures.  Dermatology Medication Tips: Please keep the boxes that  topical medications come in in order to help keep track of the instructions about where and how to use these. Pharmacies typically print the medication instructions only on the boxes and not directly on the medication tubes.   If your medication is too expensive, please contact our office at [phone] option 4 or send us  a message through MyChart.   We are unable to tell what your co-pay for medications will be in advance as this is different depending on your insurance coverage. However, we may be able to find a substitute medication at lower cost or fill out paperwork to get insurance to cover a needed medication.   If a prior authorization is required to get your medication covered by your insurance company, please allow us  1-2 business days to complete this process.  Drug prices often vary depending on where the prescription is filled and some pharmacies may offer cheaper prices.  The website www.goodrx.com contains coupons for medications through different pharmacies. The prices here do not account for what the cost may be with help from insurance (it may be cheaper with your insurance), but the website can give you the price if you did not use any insurance.  - You can print the associated coupon and take it with your prescription to the pharmacy.  - You may also stop by our office during regular business hours and pick up a GoodRx coupon card.  - If you need your prescription sent electronically to a different pharmacy, notify our office through [facility] or by phone at [phone] option 4.     Si Usted Necesita Algo Despus de Su Visita  Tambin puede enviarnos un mensaje a travs de Clinical cytogeneticist. Por lo general respondemos a los mensajes de MyChart en el transcurso de 1 a 2 das hbiles.  Para renovar recetas, por favor pida a su farmacia que se ponga en contacto con nuestra oficina. Randi lakes de fax es [place] [phone].  Si tiene un asunto urgente cuando la clnica  est cerrada y que no puede esperar hasta el siguiente da hbil, puede llamar/localizar a su doctor(a) al nmero que aparece a continuacin.   Por favor, tenga en cuenta que aunque hacemos todo lo posible para estar disponibles para asuntos urgentes fuera del horario de [place], no estamos disponibles las 24 horas del da, los 7 [address]  [address] de la [place].   Si tiene un problema urgente y no puede comunicarse con nosotros, puede optar por buscar atencin mdica  en el consultorio de su doctor(a), en una clnica privada, en un centro de atencin urgente o en una sala de emergencias.  Si tiene Engineer, drilling, por favor llame inmediatamente al 911 o vaya a la sala de emergencias.  Nmeros de bper  - Dr. Hester: [phone]  - Dra. Jackquline: [phone]  - Dr. Claudene: [phone]  - Dra. Kitts: [phone]  En caso de inclemencias del [place], por favor llame a nuestra lnea principal al [phone] para ignacia actualizacin  sobre el estado de cualquier retraso o cierre.  Consejos para la medicacin en dermatologa: Por favor, guarde las cajas en las que vienen los medicamentos de uso tpico para ayudarle a seguir las instrucciones sobre dnde y cmo usarlos. Las farmacias generalmente imprimen las instrucciones del medicamento slo en las cajas y no directamente en los tubos del [place].   Si su medicamento es muy caro, por favor, pngase en contacto con landry rieger llamando al [phone] y presione la opcin 4 o envenos un mensaje a travs de Clinical cytogeneticist.   No podemos decirle cul ser su copago por los medicamentos por adelantado ya que esto es diferente dependiendo de la cobertura de su seguro. Sin embargo, es posible que podamos encontrar un medicamento sustituto a Audiological scientist un formulario para que el seguro cubra el medicamento que se considera necesario.   Si se requiere una autorizacin previa para que su compaa de seguros malta su medicamento, por favor permtanos  de 1 a 2 das hbiles para completar este proceso.  Los precios de los medicamentos varan con frecuencia dependiendo del Environmental consultant de dnde se surte la receta y alguna farmacias pueden ofrecer precios ms baratos.  El sitio web www.goodrx.com tiene cupones para medicamentos de Health and safety inspector. Los precios aqu no tienen en cuenta lo que podra costar con la ayuda del seguro (puede ser ms barato con su seguro), pero el sitio web puede darle el precio si no utiliz Tourist information centre manager.  - Puede imprimir el cupn correspondiente y llevarlo con su receta a la farmacia.  - Tambin puede pasar por nuestra oficina durante el horario de atencin regular y Education officer, museum una tarjeta de cupones de GoodRx.  - Si necesita que su receta se enve electrnicamente a una farmacia diferente, informe a nuestra oficina a travs de MyChart de [place] o por telfono llamando al [phone] y presione la opcin 4.

## 2024-03-28 ENCOUNTER — Ambulatory Visit: Payer: Self-pay

## 2024-03-28 ENCOUNTER — Emergency Department
Admission: EM | Admit: 2024-03-28 | Discharge: 2024-03-28 | Disposition: A | Discharge status: Home / Self Care | Attending: Emergency Medicine | Admitting: Emergency Medicine

## 2024-03-28 ENCOUNTER — Other Ambulatory Visit: Payer: Self-pay

## 2024-03-28 DIAGNOSIS — L089 Local infection of the skin and subcutaneous tissue, unspecified: Secondary | ICD-10-CM | POA: Diagnosis not present

## 2024-03-28 DIAGNOSIS — S80862A Insect bite (nonvenomous), left lower leg, initial encounter: Secondary | ICD-10-CM | POA: Insufficient documentation

## 2024-03-28 DIAGNOSIS — E119 Type 2 diabetes mellitus without complications: Secondary | ICD-10-CM | POA: Diagnosis not present

## 2024-03-28 DIAGNOSIS — W57XXXA Bitten or stung by nonvenomous insect and other nonvenomous arthropods, initial encounter: Secondary | ICD-10-CM | POA: Diagnosis not present

## 2024-03-28 LAB — BASIC METABOLIC PANEL WITH GFR
Anion gap: 10 (ref 5–15)
BUN: 15 mg/dL (ref 6–20)
CO2: 22 mmol/L (ref 22–32)
Calcium: 9.3 mg/dL (ref 8.9–10.3)
Chloride: 107 mmol/L (ref 98–111)
Creatinine, Ser: 0.77 mg/dL (ref 0.44–1.00)
GFR, Estimated: >60 mL/min (ref >60)
Glucose, Bld: 110 mg/dL — ABNORMAL HIGH (ref 70–99)
Potassium: 3.6 mmol/L (ref 3.5–5.1)
Sodium: 139 mmol/L (ref 135–145)

## 2024-03-28 LAB — CBC
HCT: 38.7 % (ref 36.0–46.0)
Hemoglobin: 12.3 g/dL (ref 12.0–15.0)
MCH: 27.8 pg (ref 26.0–34.0)
MCHC: 31.8 g/dL (ref 30.0–36.0)
MCV: 87.4 fL (ref 80.0–100.0)
Platelets: 234 K/uL (ref 150–400)
RBC: 4.43 MIL/uL (ref 3.87–5.11)
RDW: 13.2 % (ref 11.5–15.5)
WBC: 7.6 K/uL (ref 4.0–10.5)
nRBC: 0 % (ref 0.0–0.2)

## 2024-03-28 MED ORDER — BACITRACIN ZINC 500 UNIT/GM EX OINT: TOPICAL_OINTMENT | Freq: Three times a day (TID) | CUTANEOUS | 0 refills | Status: DC

## 2024-03-28 MED ORDER — CEPHALEXIN 500 MG PO CAPS: 500.0000 mg | ORAL_CAPSULE | Freq: Three times a day (TID) | ORAL | 0 refills | Status: AC

## 2024-03-28 MED ORDER — CEPHALEXIN 500 MG PO CAPS
500.0000 mg | ORAL_CAPSULE | Freq: Once | ORAL | Status: AC
  Administered 2024-03-28: 500 mg via ORAL
  Filled 2024-03-28: qty 1

## 2024-03-28 MED ORDER — BACITRACIN ZINC 500 UNIT/GM EX OINT
TOPICAL_OINTMENT | Freq: Once | CUTANEOUS | Status: AC
  Administered 2024-03-28: 1 via TOPICAL
  Filled 2024-03-28: qty 0.9

## 2024-03-28 NOTE — ED Triage Notes (Signed)
 Pt to ED via POV from home. Pt reports rash on left lower leg x10 days. Pt reports itching and pain.

## 2024-03-28 NOTE — Telephone Encounter (Signed)
 FYI Only or Action Required?: FYI only for provider.  Patient was last seen in primary care on 01/10/2024 by Ostwalt, Janna, PA-C.  Called Nurse Triage reporting Wound Infection.  Symptoms began a week ago.  Interventions attempted: Nothing.  Symptoms are: rapidly worsening.  Triage Disposition: Go to ED Now (or PCP Triage)  Patient/caregiver understands and will follow disposition?: Yes Reason for Disposition  Black (necrotic), dark purple, or blisters develop in area of wound  Answer Assessment - Initial Assessment Questions Patient reports black wound that is seeping clear fluid that was first noted approximately 10 days ago. Patient is a DM. Triaged to the ED and rationales provided. Patient's family is to transport her.  1. LOCATION: Where is the wound located?      Left ankle  2. WOUND APPEARANCE: What does the wound look like?      Black with clear fluid seeping out  3. SIZE: If redness is present, ask: What is the size of the red area? (Inches, centimeters, or compare to size of a coin)      Nickel sized  4. SPREAD: What's changed in the last day?  Do you see any red streaks coming from the wound?     Red around the wound  5. ONSET: When did it start to look infected?      10 days   6. MECHANISM: How did the wound start, what was the cause?     Unknown  7. PAIN: Do you have any pain?  If Yes, ask: How bad is the pain?  (e.g., Scale 1-10; mild, moderate, or severe)     Severe  8. FEVER: Do you have a fever? If Yes, ask: What is your temperature, how was it measured, and when did it start?     Believes she had fever today, did not check with thermometer  Protocols used: Wound Infection Suspected-A-AH Copied from CRM #[id number]. Topic: Clinical - Red Word Triage >> Mar 28, 2024  4:17 PM Tobias L wrote: Red Word that prompted transfer to Nurse Triage: pain in her foot, liquid coming out of foot

## 2024-03-28 NOTE — ED Provider Notes (Signed)
 [facility] Emergency Department Provider Note     Event Date/Time   First MD Initiated Contact with Patient 03/28/24 2015     (approximate)   History   Rash   HPI  Kirsten Mccann is a 48 y.o. female With a history of HLD, diabetes, anxiety, depression, who presents to the ED accompanied by her son.  Patient reports wound to the left lower leg has been present for the last few days.  By report, she apparently got bitten by mosquito, and scratched area to the point of redness with superficial wound.  She describes itching to the area as well as some discomfort.  No frank fevers reported.  No purulent drainage is noted.  Patient is under the care of dermatology for atopic dermatitis primarily to the legs.  Physical Exam   Triage Vital Signs: ED Triage Vitals  Encounter Vitals Group     BP 03/28/24 1826 (!) 124/100     Girls Systolic BP Percentile --      Girls Diastolic BP Percentile --      Boys Systolic BP Percentile --      Boys Diastolic BP Percentile --      Pulse Rate 03/28/24 1826 76     Resp 03/28/24 1826 18     Temp 03/28/24 1826 98.5 F (36.9 C)     Temp Source 03/28/24 1826 Oral     SpO2 03/28/24 1826 98 %     Weight --      Height --      Head Circumference --      Peak Flow --      Pain Score 03/28/24 1825 8     Pain Loc --      Pain Education --      Exclude from Growth Chart --     Most recent vital signs: Vitals:   03/28/24 1826  BP: (!) 124/100  Pulse: 76  Resp: 18  Temp: 98.5 F (36.9 C)  SpO2: 98%    General Awake, no distress. NAD HEENT NCAT. PERRL. EOMI. No rhinorrhea. Mucous membranes are moist.  CV:  Good peripheral perfusion.  SKIN:  LLE with a anterior lower shin abrasion approximately 1 cm in diameter.  Superior to that is a single pustule intact.  No lymphangitis, induration, warmth, or weeping, or fluctuance noted.   ED Results / Procedures / Treatments   Labs (all labs ordered are listed, but only  abnormal results are displayed) Labs Reviewed  BASIC METABOLIC PANEL WITH GFR - Abnormal; Notable for the following components:      Result Value   Glucose, Bld 110 (*)    All other components within normal limits  CBC    EKG   RADIOLOGY   No results found.   PROCEDURES:  Critical Care performed: No  Procedures   MEDICATIONS ORDERED IN ED: Medications  cephALEXin  (KEFLEX ) capsule 500 mg (500 mg Oral Given 03/28/24 2147)  bacitracin ointment (1 Application Topical Given 03/28/24 2148)     IMPRESSION / MDM / ASSESSMENT AND PLAN / ED COURSE  I reviewed the triage vital signs and the nursing notes.                              Differential diagnosis includes, but is not limited to, contact dermatitis, folliculitis, cellulitis, abscess  Patient's presentation is most consistent with acute, uncomplicated illness.  Patient's diagnosis is consistent with acute  insect bite.  Patient with a history of skin sensitivity, presents after she scratched her into a bite to the left lower leg, now with some local ulceration and folliculitis.  Patient otherwise afebrile in no acute distress.  She will be treated for a local cellulitis.  Patient will be discharged home with prescriptions for bacitracin and Keflex . Patient is to follow up with her PCP or dermatologist as scheduled, as needed or otherwise directed. Patient is given ED precautions to return to the ED for any worsening or new symptoms.   FINAL CLINICAL IMPRESSION(S) / ED DIAGNOSES   Final diagnoses:  Bug bite with infection, initial encounter     Rx / DC Orders   ED Discharge Orders          Ordered    bacitracin ointment  3 times daily        03/28/24 2145    cephALEXin  (KEFLEX ) 500 MG capsule  3 times daily        03/28/24 2145             Note:  This document was prepared using Dragon voice recognition software and may include unintentional dictation errors.    Loyd Candida LULLA Aldona, PA-C 03/28/24  2256    Dorothyann Drivers, MD 03/28/24 2332

## 2024-03-28 NOTE — Discharge Instructions (Addendum)
 Take the prescription meds as directed.  Avoid scratching the insect bites.  Use mild soap and water to keep the area clean and covered as necessary.  Applied antibiotic ointment as prescribed.  Follow-up with your primary provider or dermatologist as suggested.  ? ???? ???? ? ???????? ??? ?? ?????. ? ?????? ? ????? ? ???? ???? ??? ??? ????. ? ????? ?? ???? ?? ? ???? ??? ????? ?? ????? ????? ??? ????? ?? ???? ??????. ? ???? ?????? ???? ? ???????? ??? ?? ????? ???. ? ??? ?????? ???? ????? ?? ? ?????? ????? ??? ????? ??? ??? ???? ?? ??????? ???.

## 2024-03-31 ENCOUNTER — Ambulatory Visit: Payer: Self-pay

## 2024-03-31 NOTE — Telephone Encounter (Signed)
 FYI Only or Action Required?: FYI only for provider.  Patient was last seen in primary care on 01/10/2024 by Ostwalt, Janna, PA-C.  Called Nurse Triage reporting Abdominal Pain.  Symptoms began about a month ago.  Interventions attempted: Rest, hydration, or home remedies.  Symptoms are: gradually worsening.  Triage Disposition: See HCP Within 4 Hours (Or PCP Triage)  Patient/caregiver understands and will follow disposition?: No, wishes to speak with PCP  Copied from CRM #[id number]. Topic: Clinical - Red Word Triage >> Mar 31, 2024  4:25 PM Delon HERO wrote: Red Word that prompted transfer to Nurse Triage: Patient's son is calling report that the patient has abdominal pain for 10 days. Reason for Disposition  [1] MILD-MODERATE pain AND [2] constant AND [3] age > 60 years  Answer Assessment - Initial Assessment Questions Abdominal pain for about a month. Patient says she the bottom part of her stomach. Denies fever, some nausea, denies vomiting. Can eat but it sometimes makes it hurt more. yesterday SBP 124. Unable to get cuff to work while on the call.  Patient denies relief. States it is constant. Advised ED/UC to evaluate. Insistent being seen in the office. Appt made for Monday to be assess. Unable to connect with CAL.   1. LOCATION: Where does it hurt?      Lower abdomen.  2. RADIATION: Does the pain shoot anywhere else? (e.g., chest, back)     Radiates to entire body 3. ONSET: When did the pain begin? (e.g., minutes, hours or days ago)      About a month ago  4. SUDDEN: Gradual or sudden onset?     Gradual at first and now to much  5. PATTERN Does the pain come and go, or is it constant?    Constant  6. SEVERITY: How bad is the pain?  (e.g., Scale 1-10; mild, moderate, or severe)     8-9/10 7. RECURRENT SYMPTOM: Have you ever had this type of stomach pain before? If Yes, ask: When was the last time? and What happened that time?      denies 8. CAUSE: What  do you think is causing the stomach pain? (e.g., gallstones, recent abdominal surgery)     unsure 9. RELIEVING/AGGRAVATING FACTORS: What makes it better or worse? (e.g., antacids, bending or twisting motion, bowel movement)     Denies any relief 10. OTHER SYMPTOMS: Do you have any other symptoms? (e.g., back pain, diarrhea, fever, urination pain, vomiting)     Occasional diarrhea- dizziness related to Topamax  managed by Neurology, denies nausea and vomiting  Protocols used: Abdominal Pain - [facility]

## 2024-04-03 ENCOUNTER — Encounter: Payer: Self-pay | Admitting: Physician Assistant

## 2024-04-03 ENCOUNTER — Ambulatory Visit (INDEPENDENT_AMBULATORY_CARE_PROVIDER_SITE_OTHER): Admitting: Physician Assistant

## 2024-04-03 VITALS — BP 145/92 | HR 65 | Temp 97.9°F | Ht 66.0 in | Wt 176.1 lb

## 2024-04-03 DIAGNOSIS — R109 Unspecified abdominal pain: Secondary | ICD-10-CM

## 2024-04-03 DIAGNOSIS — E0859 Diabetes mellitus due to underlying condition with other circulatory complications: Secondary | ICD-10-CM | POA: Diagnosis not present

## 2024-04-03 DIAGNOSIS — N912 Amenorrhea, unspecified: Secondary | ICD-10-CM | POA: Diagnosis not present

## 2024-04-03 DIAGNOSIS — R110 Nausea: Secondary | ICD-10-CM | POA: Diagnosis not present

## 2024-04-03 DIAGNOSIS — R102 Pelvic and perineal pain unspecified side: Secondary | ICD-10-CM

## 2024-04-03 DIAGNOSIS — G8929 Other chronic pain: Secondary | ICD-10-CM

## 2024-04-03 MED ORDER — SITAGLIPTIN PHOSPHATE 100 MG PO TABS: 100.0000 mg | ORAL_TABLET | Freq: Every day | ORAL | 3 refills | Status: DC

## 2024-04-03 NOTE — Progress Notes (Signed)
 " Established patient visit  Patient: Kirsten Mccann   DOB: January 28, 1976   48 y.o. Female  MRN: [id number] Visit Date: 04/03/2024  Today's healthcare provider: Jolynn Spencer, PA-C   Chief Complaint  Patient presents with   Abdominal Pain    Patient presents with constant abdominal pain x almost 1 month,some nausea no vomiting, hard stools. RN Triage repots that eating can make pain worsen. Patient has also gone around 3 months without a menstrual cycle and would like to do urine pregnancy   Subjective     HPI     Abdominal Pain    Additional comments: Patient presents with constant abdominal pain x almost 1 month,some nausea no vomiting, hard stools. RN Triage repots that eating can make pain worsen. Patient has also gone around 3 months without a menstrual cycle and would like to do urine pregnancy      Last edited by Cherry Chiquita HERO, CMA on 04/03/2024  1:52 PM.       Discussed the use of AI scribe software for clinical note transcription with the patient, who gave verbal consent to proceed.  History of Present Illness Kirsten Mccann is a 48 year old female who presents with chronic abdominal pain and bloating.  She experiences severe abdominal pain and cramps with a burning sensation that is difficult to localize. The pain is not linked to constipation or diarrhea but is accompanied by frequent gas and bloating, requiring restroom visits every two hours. Certain foods, such as yogurt, exacerbate the pain and bloating. She describes the pain as intense and located in the abdominal area.  Previous evaluations, including a CT scan of the abdomen and pelvis in 2023 and a pelvic ultrasound in September 2025, were normal. Despite these findings, her symptoms persist.  She has regular menstrual cycles and a negative pregnancy test. There is no spotting or abnormal discharge, with her discharge being watery and odorless.  She experiences frequent bowel movements, approximately four to seven  times daily, with severe cramping and gas. The pain during bowel movements is intense, with temporary relief post-bowel movement.  She takes medication nightly but notices no significant improvement. Communication is complicated by her illiteracy and remote background.  [place] [id number]     04/03/2024    1:51 PM 10/25/2023    5:09 PM 09/15/2022   10:30 AM  Depression screen PHQ 2/9  Decreased Interest   3  Down, Depressed, Hopeless 0  2  PHQ - 2 Score 0  5  Altered sleeping 3  2  Tired, decreased energy 2  1  Change in appetite 0  0  Feeling bad or failure about yourself  0  2  Trouble concentrating 0  0  Moving slowly or fidgety/restless 0  1  Suicidal thoughts 0  0  PHQ-9 Score 5  11  Difficult doing work/chores Not difficult at all  Not difficult at all     Information is confidential and restricted. Go to Review Flowsheets to unlock data.      04/03/2024    1:52 PM 10/25/2023    5:09 PM 09/15/2022   10:43 AM  GAD 7 : Generalized Anxiety Score  Nervous, Anxious, on Edge 1  3  Control/stop worrying 3  3  Worry too much - different things 2  3  Trouble relaxing 0  0  Restless 2  3  Easily annoyed or irritable 0  3  Afraid - awful might happen 0  3  Total GAD 7 Score 8  18  Anxiety Difficulty Not difficult at all  Somewhat difficult     Information is confidential and restricted. Go to Review Flowsheets to unlock data.    Medications: Outpatient Medications Prior to Visit  Medication Sig   Blood Pressure Monitoring (BLOOD PRESSURE KIT) DEVI Use to measure BP daily   clobetasol  cream (TEMOVATE ) 0.05 % APPLY 1G TWICE DAILY TO AFFECTED BODY AREAS UNTIL SMOOTH. AVOID APPLYING TO FACE, GROIN, AND AXILLA   famotidine  (PEPCID ) 20 MG tablet Take one tab po qd for stomach at lunch   glipiZIDE  (GLUCOTROL ) 5 MG tablet Take 1 tablet (5 mg total) by mouth daily before breakfast.   norethindrone  (AYGESTIN ) 5 MG tablet Take 1 tablet by mouth once daily   omeprazole  (PRILOSEC) 20 MG  capsule Take 1 capsule (20 mg total) by mouth daily.   rosuvastatin  (CRESTOR ) 10 MG tablet Take 1 tablet (10 mg total) by mouth daily.   sitaGLIPtin  (JANUVIA ) 100 MG tablet Take 1 tablet (100 mg total) by mouth daily.   bacitracin  ointment Apply topically 3 (three) times daily. Apply to affected area daily (Patient not taking: Reported on 04/03/2024)   DULoxetine  (CYMBALTA ) 30 MG capsule Take 1 capsule (30 mg total) by mouth daily with supper. (Patient not taking: Reported on 04/03/2024)   HYDROcodone -acetaminophen  (NORCO/VICODIN) 5-325 MG tablet Take 1 tablet by mouth 3 (three) times daily as needed. (Patient not taking: Reported on 04/03/2024)   metFORMIN  (GLUCOPHAGE ) 500 MG tablet Take 500 mg by mouth. (Patient not taking: Reported on 04/03/2024)   triamcinolone  cream (KENALOG ) 0.1 % Apply to rash, itchy areas on body twice a day once the rash is clear stop, Avoid applying to face and groin.  Use as directed. Long-term use can cause thinning of the skin. (Patient not taking: Reported on 04/03/2024)   Facility-Administered Medications Prior to Visit  Medication Dose Route Frequency Provider   dupilumab  (DUPIXENT ) prefilled syringe 600 mg  600 mg Subcutaneous Q14 Days Claudene Lehmann, MD    Review of Systems All negative Except see HPI       Objective    BP (!) 145/92 (BP Location: Right Arm, Patient Position: Sitting, Cuff Size: Normal)   Pulse 65   Temp 97.9 F (36.6 C) (Oral)   Ht 5' 6 (1.676 m)   Wt 176 lb 1.6 oz (79.9 kg)   LMP  (LMP Unknown)   SpO2 100%   BMI 28.42 kg/m     Physical Exam Constitutional:      General: She is not in acute distress.    Appearance: Normal appearance.  HENT:     Head: Normocephalic.  Pulmonary:     Effort: Pulmonary effort is normal. No respiratory distress.  Abdominal:     Tenderness: There is abdominal tenderness in the epigastric area, periumbilical area, suprapubic area and left upper quadrant. There is no right CVA tenderness  or left CVA tenderness.  Neurological:     Mental Status: She is alert and oriented to person, place, and time. Mental status is at baseline.      No results found for any visits on 04/03/24.       Assessment & Plan Chronic abdominal pain with bloating and cramping Chronic abdominal pain with bloating and cramping for over a year, severe, associated with frequent bowel movements and gas. Previous imaging normal. Differential includes dietary intolerance or gastrointestinal disorder. Symptoms worsen with certain foods. No relief from current medications. Referral to gastroenterology delayed. - Order CT scan of abdomen. - Stop famotidine   and omeprazole  before CT scan. - Contact scheduler to expedite gastroenterology referral. - Communicate with clinical pharmacist to review medications for potential gastrointestinal side effects. - Perform urinalysis with reflex to microscopy.  Nausea Nausea possibly related to gastrointestinal symptoms and dietary issues vs less due to pregnancy Will order CT scan of abdomen, POCT urine pregnancy Will follow-up  Diabetes mellitus due to underlying condition Chronic Diabetes management may be contributing to gastrointestinal symptoms. Metformin  is known to cause gastrointestinal side effects. - Review diabetes medications with clinical pharmacist for potential adjustments. BG from 03/28/24 110 Needs to measure her BP daily using glucometer Continue glipizide  5mg  and sitagliptan 100mg  Advised low carb diet and regular exercise Will follow-up  Amenorrhea Amenorrhea noted, but recent imaging normal. No current evidence of pregnancy or uterine abnormalities. - Continue follow-up with OBGYN.  Amenorrhea - POCT urine pregnancy  Diabetes mellitus due to underlying condition with other circulatory complication, without long-term current use of insulin (HCC) - sitaGLIPtin  (JANUVIA ) 100 MG tablet; Take 1 tablet (100 mg total) by mouth daily.  Dispense:  90 tablet; Refill: 3  Pelvic pain  - POCT urine pregnancy - CBC with Differential/Platelet - CT ABDOMEN PELVIS W CONTRAST  Nausea  - POCT urine pregnancy  Right sided abdominal pain  - H. pylori breath test  No orders of the defined types were placed in this encounter.   No follow-ups on file.   The patient was advised to call back or seek an in-person evaluation if the symptoms worsen or if the condition fails to improve as anticipated.  I discussed the assessment and treatment plan with the patient. The patient was provided an opportunity to ask questions and all were answered. The patient agreed with the plan and demonstrated an understanding of the instructions.  I, Timira Bieda, PA-C have reviewed all documentation for this visit. The documentation on 04/03/2024  for the exam, diagnosis, procedures, and orders are all accurate and complete.  Jolynn Spencer, [facility], MMS [facility] [phone] (phone) [phone] (fax)  [facility] "

## 2024-04-06 LAB — CBC WITH DIFFERENTIAL/PLATELET
Basophils Absolute: 0 x10E3/uL (ref 0.0–0.2)
Basos: 1 %
EOS (ABSOLUTE): 0.1 x10E3/uL (ref 0.0–0.4)
Eos: 1 %
Hematocrit: 37.4 % (ref 34.0–46.6)
Hemoglobin: 12.1 g/dL (ref 11.1–15.9)
Immature Grans (Abs): 0 x10E3/uL (ref 0.0–0.1)
Immature Granulocytes: 0 %
Lymphocytes Absolute: 2.5 x10E3/uL (ref 0.7–3.1)
Lymphs: 41 %
MCH: 27.7 pg (ref 26.6–33.0)
MCHC: 32.4 g/dL (ref 31.5–35.7)
MCV: 86 fL (ref 79–97)
Monocytes Absolute: 0.3 x10E3/uL (ref 0.1–0.9)
Monocytes: 5 %
Neutrophils Absolute: 3.2 x10E3/uL (ref 1.4–7.0)
Neutrophils: 52 %
Platelets: 283 x10E3/uL (ref 150–450)
RBC: 4.37 x10E6/uL (ref 3.77–5.28)
RDW: 12.9 % (ref 11.7–15.4)
WBC: 6.1 x10E3/uL (ref 3.4–10.8)

## 2024-04-06 LAB — H. PYLORI BREATH TEST

## 2024-04-07 ENCOUNTER — Ambulatory Visit: Payer: Self-pay | Admitting: Physician Assistant

## 2024-04-07 ENCOUNTER — Ambulatory Visit: Admission: RE | Admit: 2024-04-07 | Source: Ambulatory Visit

## 2024-04-11 ENCOUNTER — Ambulatory Visit: Admitting: Physician Assistant

## 2024-04-11 ENCOUNTER — Encounter: Payer: Self-pay | Admitting: Physician Assistant

## 2024-04-11 VITALS — BP 142/79 | HR 70 | Ht 65.0 in | Wt 177.0 lb

## 2024-04-11 DIAGNOSIS — Z23 Encounter for immunization: Secondary | ICD-10-CM | POA: Diagnosis not present

## 2024-04-11 DIAGNOSIS — I152 Hypertension secondary to endocrine disorders: Secondary | ICD-10-CM | POA: Diagnosis not present

## 2024-04-11 DIAGNOSIS — R109 Unspecified abdominal pain: Secondary | ICD-10-CM | POA: Diagnosis not present

## 2024-04-11 DIAGNOSIS — G8929 Other chronic pain: Secondary | ICD-10-CM

## 2024-04-11 DIAGNOSIS — E1159 Type 2 diabetes mellitus with other circulatory complications: Secondary | ICD-10-CM | POA: Diagnosis not present

## 2024-04-11 DIAGNOSIS — E785 Hyperlipidemia, unspecified: Secondary | ICD-10-CM

## 2024-04-11 DIAGNOSIS — Z7984 Long term (current) use of oral hypoglycemic drugs: Secondary | ICD-10-CM

## 2024-04-11 DIAGNOSIS — E1169 Type 2 diabetes mellitus with other specified complication: Secondary | ICD-10-CM | POA: Diagnosis not present

## 2024-04-11 LAB — POCT URINE PREGNANCY: Preg Test, Ur: NEGATIVE

## 2024-04-11 MED ORDER — VALSARTAN 40 MG PO TABS: 40.0000 mg | ORAL_TABLET | Freq: Every day | ORAL | 3 refills | Status: DC

## 2024-04-11 NOTE — Progress Notes (Signed)
 Established patient visit  Patient: Kirsten Mccann   DOB: Apr 28, 1976   48 y.o. Female  MRN: [id number] Visit Date: 04/11/2024  Today's healthcare provider: Meia Emley, PA-C   Chief Complaint  Patient presents with   Follow-up    Patient states she is here today her period had not started as of last visit, but now she reports that she is on the 3rd day of her period.  States that it is very painful and is concerned if she may have a tumor.   Subjective      Discussed the use of AI scribe software for clinical note transcription with the patient, who gave verbal consent to proceed.  History of Present Illness Kirsten Mccann is a 48 year old female with hypertension, diabetes, and hyperlipidemia who presents for management of high blood pressure and abdominal pain.  She has elevated blood pressure and is not on antihypertensive medication. This is her second occurrence of high blood pressure. She lacks a home blood pressure monitoring device.  Her diabetes is managed with regular blood sugar monitoring, with levels ranging from 100 to 124 mg/dL and occasional readings under 100 mg/dL.  She experiences abdominal pain and is scheduled to see a gastroenterologist on November 3rd. Previous lab work was normal, and a CT scan has been ordered to investigate the cause of her abdominal pain. She is currently on the third day of her menstrual cycle.       04/03/2024    1:51 PM 10/25/2023    5:09 PM 09/15/2022   10:30 AM  Depression screen PHQ 2/9  Decreased Interest   3  Down, Depressed, Hopeless 0  2  PHQ - 2 Score 0  5  Altered sleeping 3  2  Tired, decreased energy 2  1  Change in appetite 0  0  Feeling bad or failure about yourself  0  2  Trouble concentrating 0  0  Moving slowly or fidgety/restless 0  1  Suicidal thoughts 0  0  PHQ-9 Score 5  11  Difficult doing work/chores Not difficult at all  Not difficult at all     Information is confidential and restricted. Go to Review  Flowsheets to unlock data.      04/03/2024    1:52 PM 10/25/2023    5:09 PM 09/15/2022   10:43 AM  GAD 7 : Generalized Anxiety Score  Nervous, Anxious, on Edge 1  3  Control/stop worrying 3  3  Worry too much - different things 2  3  Trouble relaxing 0  0  Restless 2  3  Easily annoyed or irritable 0  3  Afraid - awful might happen 0  3  Total GAD 7 Score 8  18  Anxiety Difficulty Not difficult at all  Somewhat difficult     Information is confidential and restricted. Go to Review Flowsheets to unlock data.    Medications: Outpatient Medications Prior to Visit  Medication Sig   Blood Pressure Monitoring (BLOOD PRESSURE KIT) DEVI Use to measure BP daily   cephALEXin  (KEFLEX ) 500 MG capsule Take 500 mg by mouth 4 (four) times daily.   Cholecalciferol (VITAMIN D -3) 125 MCG (5000 UT) TABS Take 1 capsule by mouth daily.   clobetasol cream (TEMOVATE) 0.05 % APPLY 1G TWICE DAILY TO AFFECTED BODY AREAS UNTIL SMOOTH. AVOID APPLYING TO FACE, GROIN, AND AXILLA   famotidine  (PEPCID ) 20 MG tablet Take one tab po qd for stomach at lunch   glipiZIDE  (GLUCOTROL ) 5 MG tablet  Take 1 tablet (5 mg total) by mouth daily before breakfast.   norethindrone  (AYGESTIN ) 5 MG tablet Take 1 tablet by mouth once daily   omeprazole  (PRILOSEC) 20 MG capsule Take 1 capsule (20 mg total) by mouth daily.   rizatriptan (MAXALT) 5 MG tablet Take 5 mg by mouth as needed.   rosuvastatin  (CRESTOR ) 10 MG tablet Take 1 tablet (10 mg total) by mouth daily.   sitaGLIPtin  (JANUVIA ) 100 MG tablet Take 1 tablet (100 mg total) by mouth daily.   topiramate  (TOPAMAX ) 25 MG capsule Take 25 mg by mouth 2 (two) times daily.   [DISCONTINUED] bacitracin ointment Apply topically 3 (three) times daily. Apply to affected area daily (Patient not taking: Reported on 04/03/2024)   [DISCONTINUED] DULoxetine  (CYMBALTA ) 30 MG capsule Take 1 capsule (30 mg total) by mouth daily with supper. (Patient not taking: Reported on 04/03/2024)    [DISCONTINUED] HYDROcodone -acetaminophen  (NORCO/VICODIN) 5-325 MG tablet Take 1 tablet by mouth 3 (three) times daily as needed. (Patient not taking: Reported on 04/03/2024)   [DISCONTINUED] metFORMIN  (GLUCOPHAGE ) 500 MG tablet Take 500 mg by mouth. (Patient not taking: Reported on 04/03/2024)   [DISCONTINUED] triamcinolone  cream (KENALOG ) 0.1 % Apply to rash, itchy areas on body twice a day once the rash is clear stop, Avoid applying to face and groin.  Use as directed. Long-term use can cause thinning of the skin. (Patient not taking: Reported on 04/03/2024)   Facility-Administered Medications Prior to Visit  Medication Dose Route Frequency Provider   dupilumab  (DUPIXENT ) prefilled syringe 600 mg  600 mg Subcutaneous Q14 Days Claudene Lehmann, MD    Review of Systems All negative Except see HPI       Objective    BP (!) 142/79 (BP Location: Right Arm, Patient Position: Sitting, Cuff Size: Normal)   Pulse 70   Ht 5' 5 (1.651 m)   Wt 177 lb (80.3 kg)   LMP  (LMP Unknown)   SpO2 100%   BMI 29.45 kg/m     Physical Exam Vitals reviewed.  Constitutional:      General: She is not in acute distress.    Appearance: Normal appearance. She is well-developed. She is not diaphoretic.  HENT:     Head: Normocephalic and atraumatic.  Eyes:     General: No scleral icterus.    Conjunctiva/sclera: Conjunctivae normal.  Neck:     Thyroid: No thyromegaly.  Cardiovascular:     Rate and Rhythm: Normal rate and regular rhythm.     Pulses: Normal pulses.     Heart sounds: Normal heart sounds. No murmur heard. Pulmonary:     Effort: Pulmonary effort is normal. No respiratory distress.     Breath sounds: Normal breath sounds. No wheezing, rhonchi or rales.  Musculoskeletal:     Cervical back: Neck supple.     Right lower leg: No edema.     Left lower leg: No edema.  Lymphadenopathy:     Cervical: No cervical adenopathy.  Skin:    General: Skin is warm and dry.     Findings: No rash.   Neurological:     Mental Status: She is alert and oriented to person, place, and time. Mental status is at baseline.  Psychiatric:        Mood and Affect: Mood normal.        Behavior: Behavior normal.      No results found for any visits on 04/11/24.      Assessment & Plan Hypertension New diagnosis Blood pressure  elevated without current antihypertensive medication. Stomach issues require cautious treatment initiation. - Prescribed antihypertensive medication, half tablet daily in the morning/valsartan 20mg  - Monitor blood pressure daily and record readings. - Referred to clinic pharmacist for blood pressure device. - Educated on lifestyle modifications: reduce salt intake, manage stress. - Scheduled follow-up in one month to assess blood pressure control and medication tolerance.  Chronic abdominal pain Chronic abdominal pain persists despite normal imaging and lab work. Possible relation to perimenopausal changes. - Attend gastroenterology appointment on November 3rd. - Await scheduling of CT scan, depending on gi discretion. Will follow-up  Type 2 diabetes mellitus Blood glucose levels generally within target range with home monitoring. - Continue home glucose monitoring and record daily readings. - Report readings below 50 mg/dL or above 799 mg/dL.  Hyperlipidemia Chronic Hyperlipidemia is a concern alongside hypertension and diabetes. Continue crestor  10mg  Will fu  Hypertension associated with diabetes (HCC) (Primary)  - valsartan (DIOVAN) 40 MG tablet; Take 1 tablet (40 mg total) by mouth daily.  Dispense: 90 tablet; Refill: 3 - AMB Referral VBCI Care Management  Need for influenza vaccination  - Flu vaccine trivalent PF, 6mos and older(Flulaval,Afluria,Fluarix,Fluzone)   Orders Placed This Encounter  Procedures   Flu vaccine trivalent PF, 6mos and older(Flulaval,Afluria,Fluarix,Fluzone)   AMB Referral VBCI Care Management    Referral Priority:   Routine     Referral Type:   Consultation    Referral Reason:   Care Coordination    Number of Visits Requested:   1    Return in about 4 weeks (around 05/09/2024) for BP f/u, chronic disease f/u.   The patient was advised to call back or seek an in-person evaluation if the symptoms worsen or if the condition fails to improve as anticipated.  I discussed the assessment and treatment plan with the patient. The patient was provided an opportunity to ask questions and all were answered. The patient agreed with the plan and demonstrated an understanding of the instructions.  I, Kinda Pottle, PA-C have reviewed all documentation for this visit. The documentation on 04/11/2024  for the exam, diagnosis, procedures, and orders are all accurate and complete.  Jolynn Spencer, [facility], MMS [facility] [phone] (phone) [phone] (fax)  [facility]

## 2024-04-12 ENCOUNTER — Telehealth: Payer: Self-pay

## 2024-04-12 NOTE — Progress Notes (Unsigned)
 Care Guide Pharmacy Note  04/12/2024 Name: Kirsten Mccann MRN: [id number] DOB: Mar 08, 1976  Referred By: Ostwalt, Janna, PA-C Reason for referral: Complex Care Management and Call Attempt #1 (Unsuccessful initial outreach to schedule wit PHARM D- Allyson)   Kirsten Mccann is a 48 y.o. year old female who is a primary care patient of Ostwalt, Janna, PA-C.  Kirsten Mccann was referred to the pharmacist for assistance related to: HTN  An unsuccessful telephone outreach was attempted today to contact the patient who was referred to the pharmacy team for assistance with Disease management. Additional attempts will be made to contact the patient.  Kirsten Mccann, [facility] Guide  Direct Dial: [phone]  Fax [phone]

## 2024-04-13 NOTE — Progress Notes (Unsigned)
 Care Guide Pharmacy Note  04/13/2024 Name: Kirsten Mccann MRN: [id number] DOB: 1975-08-19  Referred By: Ostwalt, Janna, PA-C Reason for referral: Complex Care Management, Call Attempt #1 (Unsuccessful initial outreach to schedule wit PHARM D- Allyson), and Call Attempt #2 (Unsuccessful initial outreah to schedule with Allyson-PHARM D)   Kirsten Mccann is a 48 y.o. year old female who is a primary care patient of Ostwalt, Janna, PA-C.  Kirsten Mccann was referred to the pharmacist for assistance related to: HTN  A second unsuccessful telephone outreach was attempted today to contact the patient who was referred to the pharmacy team for assistance with medication assistance. Additional attempts will be made to contact the patient.  Kirsten Mccann [facility], [facility] Guide  Direct Dial: [phone]  Fax [phone]

## 2024-04-14 NOTE — Progress Notes (Signed)
 Care Guide Pharmacy Note  04/14/2024 Name: Maricia Scotti MRN: [id number] DOB: 09/08/1975  Referred By: Ostwalt, Janna, PA-C Reason for referral: Complex Care Management, Call Attempt #1 (Unsuccessful initial outreach to schedule wit PHARM DGLENWOOD [place]), Call Attempt #2 (Unsuccessful initial outreah to schedule with Allyson-PHARM D), and Call Attempt #3 (Successful Initial outreach scheduled with PHARM D- Allyson)   Bobbijo Kuba is a 48 y.o. year old female who is a primary care patient of Ostwalt, Janna, PA-C.  Natashia Milich was referred to the pharmacist for assistance related to: HTN  Successful contact was made with the patient to discuss pharmacy services including being ready for the pharmacist to call at least 5 minutes before the scheduled appointment time and to have medication bottles and any blood pressure readings ready for review. The patient agreed to meet with the pharmacist via In office  on (date/time). 05/04/24 @ 3 PM  Leotis Cloria Davene Salome Romell Ralph Valrie, [facility] Guide  Direct Dial: [phone]  Fax [phone]

## 2024-04-24 ENCOUNTER — Ambulatory Visit: Admitting: Dermatology

## 2024-04-24 ENCOUNTER — Encounter: Payer: Self-pay | Admitting: Dermatology

## 2024-04-24 DIAGNOSIS — L209 Atopic dermatitis, unspecified: Secondary | ICD-10-CM

## 2024-04-24 DIAGNOSIS — Z7189 Other specified counseling: Secondary | ICD-10-CM

## 2024-04-24 DIAGNOSIS — Z79899 Other long term (current) drug therapy: Secondary | ICD-10-CM | POA: Diagnosis not present

## 2024-04-24 MED ORDER — ADBRY 150 MG/ML ~~LOC~~ SOSY: 300.0000 mg | PREFILLED_SYRINGE | SUBCUTANEOUS | 4 refills | Status: AC

## 2024-04-24 MED ORDER — ADBRY 150 MG/ML ~~LOC~~ SOSY: 600.0000 mg | PREFILLED_SYRINGE | Freq: Once | SUBCUTANEOUS | 0 refills | Status: AC

## 2024-04-24 NOTE — Progress Notes (Signed)
 Follow-Up Visit   Subjective  Kirsten Mccann is a 48 y.o. female who presents for the following: Atopic Dermatitis. Has not been using Clobetasol. Sates the pharmacy told them it was not covered by her insurance. Itching, worse at night. States it is better than before.   Son is with patient and contributes to history.   The following portions of the chart were reviewed this encounter and updated as appropriate: medications, allergies, medical history  Review of Systems:  No other skin or systemic complaints except as noted in HPI or Assessment and Plan.  Objective  Well appearing patient in no apparent distress; mood and affect are within normal limits.  Areas Examined: Arms, hands, legs  Relevant physical exam findings are noted in the Assessment and Plan.    Assessment & Plan   ATOPIC DERMATITIS, UNSPECIFIED TYPE   Related Medications dupilumab  (DUPIXENT ) prefilled syringe 600 mg  LONG-TERM USE OF HIGH-RISK MEDICATION   COUNSELING AND COORDINATION OF CARE   MEDICATION MANAGEMENT     ATOPIC DERMATITIS, failed tacrolimus  (ineffective), failed clobetasol (ineffective), failed Dupixent  (due to allergy) Exam: Scaly pink papules coalescing to plaques on hands arms legs 5-10% BSA  Chronic and persistent condition with duration or expected duration over one year. Condition is symptomatic/ bothersome to patient. Not currently at goal.   Atopic dermatitis - Severe, on Dupixent  (biologic medication).  Atopic dermatitis (eczema) is a chronic, relapsing, pruritic condition that can significantly affect quality of life. It is often associated with allergic rhinitis and/or asthma and can require treatment with topical medications, phototherapy, or in severe cases biologic medications, which require long term medication management.    Treatment Plan:  Plan to start Adbry 600 mg day 0 then 300 mg day 14 and every 14 days, pending insurance approval. Sent to [facility].   Will  come in for injection on nurse schedule when medication is received.   Start Triamcinolone  0.1% cream twice a day to affected areas as needed for rash/itching. Avoid applying to face, groin, and axilla. Use as directed. Long-term use can cause thinning of the skin.  Tralokinumab Nelva) is a treatment given by injection for adults with moderate-to-severe atopic dermatitis. Goal is control of skin condition, not cure. It is given as 2 injections at the first dose followed by 1 injection ever 2-4 weeks thereafter.  Potential side effects include allergic reaction, injection site reactions and conjunctivitis (inflammation of the eyes).  The use of Adbry requires long term medication management, including periodic office visits.  Topical steroids (such as triamcinolone , fluocinolone, fluocinonide, mometasone, clobetasol, halobetasol, betamethasone, hydrocortisone) can cause thinning and lightening of the skin if they are used for too long in the same area. Your physician has selected the right strength medicine for your problem and area affected on the body. Please use your medication only as directed by your physician to prevent side effects.    Long term medication management.  Patient is using long term (months to years) prescription medication  to control their dermatologic condition.  These medications require periodic monitoring to evaluate for efficacy and side effects and may require periodic laboratory monitoring.   Recommend gentle skin care.  Right arm paresthesia. Recommend F/U with PCP.   Return in about 3 months (around 07/25/2024) for Atopic Dermatitis Follow Up.  I, Kate Fought, CMA, am acting as scribe for Boneta Sharps, MD.   Documentation: I have reviewed the above documentation for accuracy and completeness, and I agree with the above.  Boneta Sharps, MD

## 2024-04-24 NOTE — Patient Instructions (Signed)
 Plan to start Adbry pending insurance approval. Sent to [facility].   Start Triamcinolone  0.1% cream twice a day to affected areas as needed for rash/itching. Avoid applying to face, groin, and axilla. Use as directed. Long-term use can cause thinning of the skin.  Tralokinumab Nelva) is a treatment given by injection for adults with moderate-to-severe atopic dermatitis. Goal is control of skin condition, not cure. It is given as 2 injections at the first dose followed by 1 injection ever 2-4 weeks thereafter.  Potential side effects include allergic reaction, injection site reactions and conjunctivitis (inflammation of the eyes).  The use of Adbry requires long term medication management, including periodic office visits.  Topical steroids (such as triamcinolone , fluocinolone, fluocinonide, mometasone, clobetasol, halobetasol, betamethasone, hydrocortisone) can cause thinning and lightening of the skin if they are used for too long in the same area. Your physician has selected the right strength medicine for your problem and area affected on the body. Please use your medication only as directed by your physician to prevent side effects.     Due to recent changes in healthcare laws, you may see results of your pathology and/or laboratory studies on MyChart before the doctors have had a chance to review them. We understand that in some cases there may be results that are confusing or concerning to you. Please understand that not all results are received at the same time and often the doctors may need to interpret multiple results in order to provide you with the best plan of care or course of treatment. Therefore, we ask that you please give us  2 business days to thoroughly review all your results before contacting the office for clarification. Should we see a critical lab result, you will be contacted sooner.   If You Need Anything After Your Visit  If you have any questions or concerns for your  doctor, please call our main line at [phone] and press option 4 to reach your doctor's medical assistant. If no one answers, please leave a voicemail as directed and we will return your call as soon as possible. Messages left after 4 pm will be answered the following business day.   You may also send us  a message via MyChart. We typically respond to MyChart messages within 1-2 business days.  For prescription refills, please ask your pharmacy to contact our office. Our fax number is [phone].  If you have an urgent issue when the clinic is closed that cannot wait until the next business day, you can page your doctor at the number below.    Please note that while we do our best to be available for urgent issues outside of office hours, we are not available 24/7.   If you have an urgent issue and are unable to reach us , you may choose to seek medical care at your doctor's office, retail clinic, urgent care center, or emergency room.  If you have a medical emergency, please immediately call 911 or go to the emergency department.  Pager Numbers  - Dr. Hester: [phone]  - Dr. Jackquline: [phone]  - Dr. Claudene: [phone]   - Dr. Raymund: [phone]  In the event of inclement weather, please call our main line at [phone] for an update on the status of any delays or closures.  Dermatology Medication Tips: Please keep the boxes that topical medications come in in order to help keep track of the instructions about where and how to use these. Pharmacies typically print the medication instructions only on the  boxes and not directly on the medication tubes.   If your medication is too expensive, please contact our office at [phone] option 4 or send us  a message through MyChart.   We are unable to tell what your co-pay for medications will be in advance as this is different depending on your insurance coverage. However, we may be able to find a substitute medication at  lower cost or fill out paperwork to get insurance to cover a needed medication.   If a prior authorization is required to get your medication covered by your insurance company, please allow us  1-2 business days to complete this process.  Drug prices often vary depending on where the prescription is filled and some pharmacies may offer cheaper prices.  The website www.goodrx.com contains coupons for medications through different pharmacies. The prices here do not account for what the cost may be with help from insurance (it may be cheaper with your insurance), but the website can give you the price if you did not use any insurance.  - You can print the associated coupon and take it with your prescription to the pharmacy.  - You may also stop by our office during regular business hours and pick up a GoodRx coupon card.  - If you need your prescription sent electronically to a different pharmacy, notify our office through [facility] or by phone at [phone] option 4.     Si Usted Necesita Algo Despus de Su Visita  Tambin puede enviarnos un mensaje a travs de Clinical Cytogeneticist. Por lo general respondemos a los mensajes de MyChart en el transcurso de 1 a 2 das hbiles.  Para renovar recetas, por favor pida a su farmacia que se ponga en contacto con nuestra oficina. Randi lakes de fax es [place] [phone].  Si tiene un asunto urgente cuando la clnica est cerrada y que no puede esperar hasta el siguiente da hbil, puede llamar/localizar a su doctor(a) al nmero que aparece a continuacin.   Por favor, tenga en cuenta que aunque hacemos todo lo posible para estar disponibles para asuntos urgentes fuera del horario de [place], no estamos disponibles las 24 horas del da, los 7 [address]  [phone] de la [place].   Si tiene un problema urgente y no puede comunicarse con nosotros, puede optar por buscar atencin mdica  en el consultorio de su doctor(a), en una clnica privada, en un centro de atencin urgente  o en una sala de emergencias.  Si tiene engineer, drilling, por favor llame inmediatamente al 911 o vaya a la sala de emergencias.  Nmeros de bper  - Dr. Hester: [phone]  - Dra. Jackquline: [phone]  - Dr. Claudene: [phone]  - Dra. Kitts: [phone]  En caso de inclemencias del [place], por favor llame a nuestra lnea principal al [phone] para una actualizacin sobre el estado de cualquier retraso o cierre.  Consejos para la medicacin en dermatologa: Por favor, guarde las cajas en las que vienen los medicamentos de uso tpico para ayudarle a seguir las instrucciones sobre dnde y cmo usarlos. Las farmacias generalmente imprimen las instrucciones del medicamento slo en las cajas y no directamente en los tubos del [place].   Si su medicamento es muy caro, por favor, pngase en contacto con landry rieger llamando al [phone] y presione la opcin 4 o envenos un mensaje a travs de Clinical Cytogeneticist.   No podemos decirle cul ser su copago por los medicamentos por adelantado ya que esto es diferente dependiendo de la cobertura de su seguro. Sin  embargo, es posible que podamos encontrar un medicamento sustituto a audiological scientist un formulario para que el seguro cubra el medicamento que se considera necesario.   Si se requiere una autorizacin previa para que su compaa de seguros cubra su medicamento, por favor permtanos de 1 a 2 das hbiles para completar este proceso.  Los precios de los medicamentos varan con frecuencia dependiendo del environmental consultant de dnde se surte la receta y alguna farmacias pueden ofrecer precios ms baratos.  El sitio web www.goodrx.com tiene cupones para medicamentos de health and safety inspector. Los precios aqu no tienen en cuenta lo que podra costar con la ayuda del seguro (puede ser ms barato con su seguro), pero el sitio web puede darle el precio si no utiliz tourist information centre manager.  - Puede imprimir el cupn correspondiente y llevarlo con su  receta a la farmacia.  - Tambin puede pasar por nuestra oficina durante el horario de atencin regular y education officer, museum una tarjeta de cupones de GoodRx.  - Si necesita que su receta se enve electrnicamente a una farmacia diferente, informe a nuestra oficina a travs de MyChart de [place] o por telfono llamando al [phone] y presione la opcin 4.

## 2024-04-27 ENCOUNTER — Other Ambulatory Visit: Payer: Self-pay

## 2024-04-27 ENCOUNTER — Encounter
Admission: RE | Disposition: A | Discharge status: Home / Self Care | Payer: Self-pay | Source: Home / Self Care | Attending: Gastroenterology

## 2024-04-27 ENCOUNTER — Ambulatory Visit
Admission: RE | Admit: 2024-04-27 | Discharge: 2024-04-27 | Disposition: A | Discharge status: Home / Self Care | Attending: Gastroenterology | Admitting: Gastroenterology

## 2024-04-27 ENCOUNTER — Ambulatory Visit: Admitting: Certified Registered"

## 2024-04-27 ENCOUNTER — Encounter: Payer: Self-pay | Admitting: Gastroenterology

## 2024-04-27 DIAGNOSIS — K219 Gastro-esophageal reflux disease without esophagitis: Secondary | ICD-10-CM | POA: Diagnosis not present

## 2024-04-27 DIAGNOSIS — Z7984 Long term (current) use of oral hypoglycemic drugs: Secondary | ICD-10-CM | POA: Insufficient documentation

## 2024-04-27 DIAGNOSIS — R197 Diarrhea, unspecified: Secondary | ICD-10-CM | POA: Diagnosis present

## 2024-04-27 DIAGNOSIS — D124 Benign neoplasm of descending colon: Secondary | ICD-10-CM | POA: Diagnosis not present

## 2024-04-27 DIAGNOSIS — E119 Type 2 diabetes mellitus without complications: Secondary | ICD-10-CM | POA: Diagnosis not present

## 2024-04-27 DIAGNOSIS — R1031 Right lower quadrant pain: Secondary | ICD-10-CM | POA: Diagnosis present

## 2024-04-27 DIAGNOSIS — K529 Noninfective gastroenteritis and colitis, unspecified: Secondary | ICD-10-CM | POA: Insufficient documentation

## 2024-04-27 LAB — GLUCOSE, CAPILLARY: Glucose-Capillary: 104 mg/dL — ABNORMAL HIGH (ref 70–99)

## 2024-04-27 LAB — POCT PREGNANCY, URINE: Preg Test, Ur: NEGATIVE

## 2024-04-27 SURGERY — COLONOSCOPY
Anesthesia: General

## 2024-04-27 MED ORDER — LIDOCAINE HCL (CARDIAC) PF 100 MG/5ML IV SOSY
PREFILLED_SYRINGE | INTRAVENOUS | Status: DC | PRN
  Administered 2024-04-27: 60 mg via INTRAVENOUS

## 2024-04-27 MED ORDER — PROPOFOL 10 MG/ML IV BOLUS
INTRAVENOUS | Status: DC | PRN
  Administered 2024-04-27: 60 mg via INTRAVENOUS

## 2024-04-27 MED ORDER — SODIUM CHLORIDE 0.9 % IV SOLN: INTRAVENOUS | Status: DC

## 2024-04-27 MED ORDER — PROPOFOL 500 MG/50ML IV EMUL
INTRAVENOUS | Status: DC | PRN
  Administered 2024-04-27: 130 ug/kg/min via INTRAVENOUS

## 2024-04-27 NOTE — Transfer of Care (Signed)
 Immediate Anesthesia Transfer of Care Note  Patient: Kirsten Mccann  Procedure(s) Performed: COLONOSCOPY POLYPECTOMY, INTESTINE  Patient Location: PACU  Anesthesia Type:General  Level of Consciousness: drowsy  Airway & Oxygen Therapy: Patient Spontanous Breathing  Post-op Assessment: Report given to RN  Post vital signs: stable  Last Vitals:  Vitals Value Taken Time  BP    Temp    Pulse 64 04/27/24 10:15  Resp    SpO2 99 % 04/27/24 10:15  Vitals shown include unfiled device data.  Last Pain:  Vitals:   04/27/24 0938  TempSrc: Temporal  PainSc: 0-No pain         Complications: No notable events documented.

## 2024-04-27 NOTE — Anesthesia Postprocedure Evaluation (Signed)
 Anesthesia Post Note  Patient: Furniture Conservator/restorer  Procedure(s) Performed: COLONOSCOPY POLYPECTOMY, INTESTINE  Patient location during evaluation: Endoscopy Anesthesia Type: General Level of consciousness: awake and alert Pain management: pain level controlled Vital Signs Assessment: post-procedure vital signs reviewed and stable Respiratory status: spontaneous breathing, nonlabored ventilation and respiratory function stable Cardiovascular status: blood pressure returned to baseline and stable Postop Assessment: no apparent nausea or vomiting Anesthetic complications: no   No notable events documented.   Last Vitals:  Vitals:   04/27/24 1025 04/27/24 1035  BP: 108/65 124/78  Pulse: (!) 57 (!) 49  Resp: 13 13  Temp:    SpO2: 100% 100%    Last Pain:  Vitals:   04/27/24 1035  TempSrc:   PainSc: 0-No pain                 Fairy POUR Janesa Dockery

## 2024-04-27 NOTE — Op Note (Signed)
 [facility] Patient Name: Kirsten Mccann Procedure Date: 04/27/2024 9:54 AM MRN: [id number] Account #: [id number] Date of Birth: 12-Dec-1975 Admit Type: Outpatient Age: 48 Room: [facility] ENDO ROOM 3 Gender: Female Note Status: Finalized Instrument Name: Colon Scope [id number] Procedure:             Colonoscopy Indications:           This is the patient's first colonoscopy, Lower                         abdominal pain, Clinically significant diarrhea of                         unexplained origin Providers:             Corinn Jess Brooklyn MD, MD Referring MD:          Corinn Jess Brooklyn MD, MD (Referring MD), Janna                         Ostwalt (Referring MD) Medicines:             General Anesthesia Complications:         No immediate complications. Estimated blood loss: None. Procedure:             Pre-Anesthesia Assessment:                        - Prior to the procedure, a History and Physical was                         performed, and patient medications and allergies were                         reviewed. The patient is competent. The risks and                         benefits of the procedure and the sedation options and                         risks were discussed with the patient. All questions                         were answered and informed consent was obtained.                         Patient identification and proposed procedure were                         verified by the physician, the nurse, the                         anesthesiologist, the anesthetist and the technician                         in the pre-procedure area in the procedure room in the                         endoscopy suite. Mental Status Examination: alert and  oriented. Airway Examination: normal oropharyngeal                         airway and neck mobility. Respiratory Examination:                         clear to auscultation. CV Examination:  normal.                         Prophylactic Antibiotics: The patient does not require                         prophylactic antibiotics. Prior Anticoagulants: The                         patient has taken no anticoagulant or antiplatelet                         agents. ASA Grade Assessment: III - A patient with                         severe systemic disease. After reviewing the risks and                         benefits, the patient was deemed in satisfactory                         condition to undergo the procedure. The anesthesia                         plan was to use general anesthesia. Immediately prior                         to administration of medications, the patient was                         re-assessed for adequacy to receive sedatives. The                         heart rate, respiratory rate, oxygen saturations,                         blood pressure, adequacy of pulmonary ventilation, and                         response to care were monitored throughout the                         procedure. The physical status of the patient was                         re-assessed after the procedure.                        After obtaining informed consent, the colonoscope was                         passed under direct vision. Throughout the procedure,  the patient's blood pressure, pulse, and oxygen                         saturations were monitored continuously. The                         Colonoscope was introduced through the anus and                         advanced to the the terminal ileum, with                         identification of the appendiceal orifice and IC                         valve. The colonoscopy was performed without                         difficulty. The patient tolerated the procedure well.                         The quality of the bowel preparation was poor. The                         terminal ileum, ileocecal valve, appendiceal  orifice,                         and rectum were photographed. Findings:      The perianal and digital rectal examinations were normal. Pertinent       negatives include normal sphincter tone and no palpable rectal lesions.      The terminal ileum appeared normal.      Two sessile polyps were found in the descending colon. The polyps were       diminutive in size. These polyps were removed with a cold biopsy       forceps. Resection and retrieval were complete.      Normal mucosa was found in the left colon and in the right colon.       Biopsies for histology were taken with a cold forceps from the entire       colon for evaluation of microscopic colitis. Estimated blood loss: none.      The retroflexed view of the distal rectum and anal verge was normal and       showed no anal or rectal abnormalities. Impression:            - Preparation of the colon was poor.                        - The examined portion of the ileum was normal.                        - Two diminutive polyps in the descending colon,                         removed with a cold biopsy forceps. Resected and                         retrieved.                        -  Normal mucosa in the left colon and in the right                         colon. Biopsied.                        - The distal rectum and anal verge are normal on                         retroflexion view. Recommendation:        - Discharge patient to home (with escort).                        - Resume previous diet today.                        - Continue present medications.                        - Await pathology results.                        - Return to GI clinic as previously scheduled. 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 All rights reserved. The codes documented in this report are preliminary and upon coder review may  be revised to meet current compliance requirements. Dr. Corinn Brooklyn Corinn Jess Brooklyn MD, MD 04/27/2024 10:14:34 AM This report has been signed electronically. Number of Addenda: 0 Note Initiated On: 04/27/2024 9:54 AM Scope Withdrawal Time: 0 hours 10 minutes 36 seconds  Total Procedure Duration: 0 hours 12 minutes 13 seconds  Estimated Blood Loss:  Estimated blood loss: none.      [facility]

## 2024-04-27 NOTE — Anesthesia Preprocedure Evaluation (Signed)
 Anesthesia Evaluation  Patient identified by MRN, date of birth, ID band Patient awake    Reviewed: Allergy & Precautions, NPO status , Patient's Chart, lab work & pertinent test results  History of Anesthesia Complications Negative for: history of anesthetic complications  Airway Mallampati: III  TM Distance: <3 FB Neck ROM: full    Dental  (+) Chipped, Poor Dentition   Pulmonary neg pulmonary ROS, neg shortness of breath   Pulmonary exam normal        Cardiovascular Exercise Tolerance: Good hypertension, (-) angina Normal cardiovascular exam     Neuro/Psych  Headaches  negative psych ROS   GI/Hepatic Neg liver ROS,GERD  Controlled,,  Endo/Other  diabetes, Type 2    Renal/GU negative Renal ROS  negative genitourinary   Musculoskeletal   Abdominal   Peds  Hematology negative hematology ROS (+)   Anesthesia Other Findings Past Medical History: No date: Anxiety No date: Depression No date: Diabetes (HCC) No date: Diabetes (HCC) No date: GERD (gastroesophageal reflux disease) No date: HLD No date: Hyperlipidemia  Past Surgical History: No date: NO PAST SURGERIES     Reproductive/Obstetrics negative OB ROS                              Anesthesia Physical Anesthesia Plan  ASA: 3  Anesthesia Plan: General   Post-op Pain Management:    Induction: Intravenous  PONV Risk Score and Plan: Propofol infusion and TIVA  Airway Management Planned: Natural Airway and Nasal Cannula  Additional Equipment:   Intra-op Plan:   Post-operative Plan:   Informed Consent: I have reviewed the patients History and Physical, chart, labs and discussed the procedure including the risks, benefits and alternatives for the proposed anesthesia with the patient or authorized representative who has indicated his/her understanding and acceptance.     Dental Advisory Given and Interpreter used for  interview  Plan Discussed with: Anesthesiologist, CRNA and Surgeon  Anesthesia Plan Comments: (Patient consented for risks of anesthesia including but not limited to:  - adverse reactions to medications - risk of airway placement if required - damage to eyes, teeth, lips or other oral mucosa - nerve damage due to positioning  - sore throat or hoarseness - Damage to heart, brain, nerves, lungs, other parts of body or loss of life  Patient voiced understanding and assent.)        Anesthesia Quick Evaluation

## 2024-04-27 NOTE — H&P (Signed)
 Corinn JONELLE Brooklyn, MD [facility], DHIP [address]  [place], [place] [address]  Main: [phone] Fax:  [phone] Pager: [phone]   Primary Care Physician:  Dineen Channel, PA-C Primary Gastroenterologist:  Dr. Corinn JONELLE Brooklyn  Pre-Procedure History & Physical: HPI:  Kirsten Mccann is a 48 y.o. female is here for an colonoscopy.   Past Medical History:  Diagnosis Date   Anxiety    Depression    Diabetes (HCC)    Diabetes (HCC)    GERD (gastroesophageal reflux disease)    HLD    Hyperlipidemia     Past Surgical History:  Procedure Laterality Date   NO PAST SURGERIES      Prior to Admission medications   Medication Sig Start Date End Date Taking? Authorizing Provider  famotidine  (PEPCID ) 20 MG tablet Take one tab po qd for stomach at lunch 04/29/23  Yes Ostwalt, Janna, PA-C  glipiZIDE  (GLUCOTROL ) 5 MG tablet Take 1 tablet (5 mg total) by mouth daily before breakfast. 07/16/23  Yes Ostwalt, Janna, PA-C  rosuvastatin  (CRESTOR ) 10 MG tablet Take 1 tablet (10 mg total) by mouth daily. 07/02/23  Yes Ostwalt, Janna, PA-C  sitaGLIPtin  (JANUVIA ) 100 MG tablet Take 1 tablet (100 mg total) by mouth daily. 04/03/24  Yes Ostwalt, Janna, PA-C  topiramate  (TOPAMAX ) 25 MG capsule Take 25 mg by mouth 2 (two) times daily.   Yes [provider]  valsartan (DIOVAN) 40 MG tablet Take 1 tablet (40 mg total) by mouth daily. 04/11/24  Yes Ostwalt, Janna, PA-C  Blood Pressure Monitoring (BLOOD PRESSURE KIT) DEVI Use to measure BP daily 04/30/23   Ostwalt, Janna, PA-C  cephALEXin  (KEFLEX ) 500 MG capsule Take 500 mg by mouth 4 (four) times daily.    [provider]  Cholecalciferol (VITAMIN D -3) 125 MCG (5000 UT) TABS Take 1 capsule by mouth daily.    [provider]  clobetasol cream (TEMOVATE) 0.05 % APPLY 1G TWICE DAILY TO AFFECTED BODY AREAS UNTIL SMOOTH. AVOID APPLYING TO FACE, GROIN, AND AXILLA 03/27/24   Claudene Lehmann, MD   norethindrone  (AYGESTIN ) 5 MG tablet Take 1 tablet by mouth once daily 04/01/23   Copland, Alicia B, PA-C  omeprazole  (PRILOSEC) 20 MG capsule Take 1 capsule (20 mg total) by mouth daily. 07/16/23   Ostwalt, Janna, PA-C  rizatriptan (MAXALT) 5 MG tablet Take 5 mg by mouth as needed.    [provider]  tralokinumab (ADBRY) 150 MG/ML SOSY Inject 2 mLs (300 mg total) into the skin every 14 (fourteen) days. Starting on day 15 for maintenance. 04/24/24   Claudene Lehmann, MD    Allergies as of 04/17/2024 - Review Complete 04/11/2024  Allergen Reaction Noted   Dupilumab  Rash 03/08/2024    Family History  Problem Relation Age of Onset   Mental illness Neg Hx     Social History   Socioeconomic History   Marital status: Married    Spouse name: Not on file   Number of children: Not on file   Years of education: none   Highest education level: Not on file  Occupational History   Not on file  Tobacco Use   Smoking status: Never   Smokeless tobacco: Never  Vaping Use   Vaping status: Never Used  Substance and Sexual Activity   Alcohol use: Never   Drug use: Never   Sexual activity: Yes    Birth control/protection: None  Other Topics Concern   Not on file  Social History Narrative  Not on file   Social Drivers of Health   Financial Resource Strain: Not on file  Food Insecurity: Not on file  Transportation Needs: Not on file  Physical Activity: Not on file  Stress: Not on file  Social Connections: Not on file  Intimate Partner Violence: Not on file    Review of Systems: See HPI, otherwise negative ROS  Physical Exam: BP 121/74   Pulse (!) 57   Temp (!) 96 F (35.6 C) (Temporal)   Resp 14   Wt 79.7 kg   LMP 04/06/2024   SpO2 100%   BMI 29.22 kg/m  General:   Alert,  pleasant and cooperative in NAD Head:  Normocephalic and atraumatic. Neck:  Supple; no masses or thyromegaly. Lungs:  Clear throughout to auscultation.    Heart:  Regular rate and  rhythm. Abdomen:  Soft, nontender and nondistended. Normal bowel sounds, without guarding, and without rebound.   Neurologic:  Alert and  oriented x4;  grossly normal neurologically.  Impression/Plan: Kirsten Mccann is here for an colonoscopy to be performed for lower abdominal pain, chronic diarrhea  Risks, benefits, limitations, and alternatives regarding  colonoscopy have been reviewed with the patient.  Questions have been answered.  All parties agreeable.   Corinn Brooklyn, MD  04/27/2024, 9:54 AM

## 2024-04-28 LAB — SURGICAL PATHOLOGY

## 2024-04-30 ENCOUNTER — Ambulatory Visit: Payer: Self-pay | Admitting: Gastroenterology

## 2024-04-30 ENCOUNTER — Encounter: Payer: Self-pay | Admitting: Dermatology

## 2024-05-04 ENCOUNTER — Ambulatory Visit

## 2024-05-04 NOTE — Progress Notes (Deleted)
 S:     Reason for visit: ?  Kirsten Mccann is a 48 y.o. female with a history of diabetes (type 2), who presents today for an initial diabetes Face to Face pharmacotherapy visit.? Pertinent PMH also includes HTN, HLD, PTSD, obesity, anxiety, depression.  Care Team: Primary Care Provider: Ostwalt, Janna, PA-C  At last visit with PCP on 04/11/24, BP was elevated at 142/79 mmHg. Patient was started on valsartan 40 mg daily   Patient reports Diabetes was diagnosed in ***.   Current diabetes medications include: glipizide  5 mg daily, Januvia  100 mg daily  Previous diabetes medications include: *** Current hypertension medications include: valsartan 40 mg daily  Current hyperlipidemia medications include: rosuvastatin  10 mg daily   Patient reports adherence to taking all medications as prescribed.  *** Patient denies adherence with medications, reports missing *** medications *** times per week, on average.  Have you been experiencing any side effects to the medications prescribed? {YES NO:22349} Do you have any problems obtaining medications due to transportation or finances? {YES [id number] Insurance coverage: [place] Medicaid  Patient {Actions; denies-reports:120008} hypoglycemic events.  Reported home fasting blood sugars: ***  Reported 2 hour post-meal/random blood sugars: ***.  Patient {Actions; denies-reports:120008} nocturia (nighttime urination).  Patient {Actions; denies-reports:120008} neuropathy (nerve pain). Patient {Actions; denies-reports:120008} visual changes. Patient {Actions; denies-reports:120008} self foot exams.   Patient reported dietary habits: Eats *** meals/day Breakfast: *** Lunch: *** Dinner: *** Snacks: *** Drinks: ***  Patient-reported exercise habits: ***  Hypertension: Patient {HAS/DOES NOT YJCZ:65250} a validated, automated, upper arm home BP cuff Current blood pressure readings readings: ***  Patient {Actions; denies-reports:120008} hypotensive  s/sx including ***dizziness, lightheadedness.  Patient {Actions; denies-reports:120008} hypertensive symptoms including ***headache, chest pain, shortness of breath  DM Prevention:  Statin: {Blank single:19197::***,Taking,Not taking,Intolerant to,Declines}; {Blank single:19197::low intensity,moderate intensity,high intensity,n/a}.?  ACE/ARB: {Blank single:19197::yes,no}; *** History of chronic kidney disease? {Blank single:19197::yes,no} Last urinary albumin/creatinine ratio:  Lab Results  Component Value Date   MICRALBCREAT <8 06/30/2022   Last eye exam:  Lab Results  Component Value Date   HMDIABEYEEXA No Retinopathy 10/19/2023   Lab Results  Component Value Date   HMDIABEYEEXA No Retinopathy 10/19/2023   Last foot exam: No foot exam found Tobacco Use:  Tobacco Use: Low Risk  (04/30/2024)   Patient History    Smoking Tobacco Use: Never    Smokeless Tobacco Use: Never    Passive Exposure: Not on file   O:  {CGM reports:33570} 7 day average blood glucose: ***  Libre3 CGM Download today *** % Time CGM is active: ***% Average Glucose: *** mg/dL Glucose Management Indicator: ***  Glucose Variability: ***% (goal <36%) Time in Goal:  - Time in range 70-180: ***% - Time above range: ***% - Time below range: ***% Observed patterns:  Vitals:  Wt Readings from Last 3 Encounters:  04/27/24 175 lb 9.6 oz (79.7 kg)  04/11/24 177 lb (80.3 kg)  04/03/24 176 lb 1.6 oz (79.9 kg)   BP Readings from Last 3 Encounters:  04/27/24 124/78  04/11/24 (!) 142/79  04/03/24 (!) 145/92   Pulse Readings from Last 3 Encounters:  04/27/24 (!) 49  04/11/24 70  04/03/24 65     Labs:?  Lab Results  Component Value Date   HGBA1C 7.9 12/28/2023   HGBA1C 8.2 (H) 06/29/2023   HGBA1C 7.0 (H) 02/04/2023   GLUCOSE 110 (H) 03/28/2024   MICRALBCREAT <8 06/30/2022   CREATININE 0.77 03/28/2024   CREATININE 0.77 03/08/2024   CREATININE 0.57 12/26/2023  Lab  Results  Component Value Date   CHOL 124 12/28/2023   LDLCALC 58 12/28/2023   LDLCALC 127 (H) 06/29/2023   LDLCALC 125 (H) 02/04/2023   HDL 32 (A) 12/28/2023   TRIG 171 (A) 12/28/2023   TRIG 359 (H) 06/29/2023   TRIG 261 (H) 02/04/2023   ALT 18 03/08/2024   ALT 24 12/26/2023   AST 16 03/08/2024   AST 20 12/26/2023      Chemistry      Component Value Date/Time   NA 139 03/28/2024 1826   NA 140 03/08/2024 0946   K 3.6 03/28/2024 1826   CL 107 03/28/2024 1826   CO2 22 03/28/2024 1826   BUN 15 03/28/2024 1826   BUN 14 03/08/2024 0946   CREATININE 0.77 03/28/2024 1826      Component Value Date/Time   CALCIUM  9.3 03/28/2024 1826   ALKPHOS 65 03/08/2024 0946   AST 16 03/08/2024 0946   ALT 18 03/08/2024 0946   BILITOT 0.4 03/08/2024 0946       The ASCVD Risk score (Arnett DK, et al., 2019) failed to calculate for the following reasons:   The valid total cholesterol range is 130 to 320 mg/dL  Lab Results  Component Value Date   MICRALBCREAT <8 06/30/2022    A/P: Diabetes currently *** with a most recent A1c of *** on ***, which is {DESC; UP/DOWN/UNCHANGED:18711} from *** on ***. Patient is *** able to verbalize appropriate hypoglycemia management plan. Medication adherence appears ***. Control is suboptimal due to ***. -{Meds adjust:18428} basal insulin {basal insulins:33573}  *** units daily.  -{Meds adjust:18428} rapid insulin {bolus insulin:33574} ***.  -{Meds adjust:18428} GLP-1 {GLP1 options:33572} *** mg .  -{Meds adjust:18428} SGLT2-I {SGLT2i options:33571}*** mg daily.  -{Meds adjust:18428} metformin  ***.  -Patient educated on purpose, proper use, and potential adverse effects of ***.  -Extensively discussed pathophysiology of diabetes, recommended lifestyle interventions, dietary effects on blood sugar control.  -Counseled on s/sx of and management of hypoglycemia.  -Next A1c anticipated ***.   ASCVD risk - primary prevention in patient with diabetes. Last  LDL is 58 mg/dL, at goal of <29 mg/dL. -Continued rosuvastatin  10 mg daily.   Hypertension longstanding *** currently ***. Blood pressure goal of <130/80 *** mmHg. Medication adherence ***. Blood pressure control is suboptimal due to ***. -{Meds adjust:18428} *** mg ***.  {pharmacisttime:33368}  Follow-up:  Pharmacist on *** PCP clinic visit on ***  [name], PharmD, CPP Clinical Pharmacist [facility] [phone]

## 2024-05-12 NOTE — Progress Notes (Unsigned)
 Established patient visit  Patient: Kirsten Mccann   DOB: 11-04-1975   48 y.o. Female  MRN: [id number] Visit Date: 05/17/2024  Today's healthcare provider: Jolynn Spencer, PA-C   No chief complaint on file.  Subjective       Discussed the use of AI scribe software for clinical note transcription with the patient, who gave verbal consent to proceed.  History of Present Illness Gianny Ibanez is a 48 year old female with hypertension who presents with abdominal pain and stiffness.  She reports severe abdominal pain and stiffness that worsens with her menstrual cycle and feels like labor pain. Symptoms began after starting a new blood pressure medication, which she is still taking.  She had an intestinal procedure a few days ago, after which the abdominal pain improved, but the stiffness persists. For the past two years she has had severe menstrual pain often with frequent loose stools. Since the procedure, overall pain is much better, but stiffness remains her main concern.  She takes medications for cholesterol and diabetes without issues. She was expecting home devices to check blood glucose and blood pressure, but they have not arrived, and she currently does not have a blood pressure device at home.  She also has nighttime tingling in one arm and knee pain that makes it hard to sit and stand. [facility] [place] [phone] 1.  Abdominal pain-reports bilateral lower abdominal pain that significantly intensifies during menstrual cycles but is persistent most days.  She does endorse diarrhea including nocturnal diarrhea.  Feels the nocturnal diarrhea occurs pretty often.  Will check GI PCR and fecal calprotectin.  She has never had colonoscopy and will schedule colonoscopy for evaluation.  She had stable labs at PCP. Will give Levsin to use as needed for abdominal pain.  She is also following with GYN for the abdominal pain.   2.  GERD-feels it occurs when the abdominal pain is the worst.   Continue PPI and Pepcid . Would consider EGD if symptoms worsen.  Negative H. pylori testing.    Denies any family history of colon  [place] sur path     04/03/2024    1:51 PM 10/25/2023    5:09 PM 09/15/2022   10:30 AM  Depression screen PHQ 2/9  Decreased Interest   3  Down, Depressed, Hopeless 0  2  PHQ - 2 Score 0  5  Altered sleeping 3  2  Tired, decreased energy 2  1  Change in appetite 0  0  Feeling bad or failure about yourself  0  2  Trouble concentrating 0  0  Moving slowly or fidgety/restless 0  1  Suicidal thoughts 0  0  PHQ-9 Score 5   11   Difficult doing work/chores Not difficult at all  Not difficult at all     Information is confidential and restricted. Go to Review Flowsheets to unlock data.   Data saved with a previous flowsheet row definition      04/03/2024    1:52 PM 10/25/2023    5:09 PM 09/15/2022   10:43 AM  GAD 7 : Generalized Anxiety Score  Nervous, Anxious, on Edge 1  3  Control/stop worrying 3  3  Worry too much - different things 2  3  Trouble relaxing 0  0  Restless 2  3  Easily annoyed or irritable 0  3  Afraid - awful might happen 0  3  Total GAD 7 Score 8  18  Anxiety Difficulty Not difficult at  all  Somewhat difficult     Information is confidential and restricted. Go to Review Flowsheets to unlock data.    Medications: Outpatient Medications Prior to Visit  Medication Sig   Blood Pressure Monitoring (BLOOD PRESSURE KIT) DEVI Use to measure BP daily   cephALEXin  (KEFLEX ) 500 MG capsule Take 500 mg by mouth 4 (four) times daily.   Cholecalciferol (VITAMIN D -3) 125 MCG (5000 UT) TABS Take 1 capsule by mouth daily.   clobetasol  cream (TEMOVATE ) 0.05 % APPLY 1G TWICE DAILY TO AFFECTED BODY AREAS UNTIL SMOOTH. AVOID APPLYING TO FACE, GROIN, AND AXILLA   famotidine  (PEPCID ) 20 MG tablet Take one tab po qd for stomach at lunch   glipiZIDE  (GLUCOTROL  XL) 5 MG 24 hr tablet Take 5 mg by mouth daily with breakfast.   glipiZIDE  (GLUCOTROL ) 5 MG  tablet Take 1 tablet (5 mg total) by mouth daily before breakfast.   hyoscyamine (LEVSIN) 0.125 MG tablet Take 0.125 mg by mouth every 4 (four) hours as needed.   norethindrone  (AYGESTIN ) 5 MG tablet Take 1 tablet by mouth once daily   omeprazole  (PRILOSEC) 20 MG capsule Take 1 capsule (20 mg total) by mouth daily.   rizatriptan (MAXALT) 5 MG tablet Take 5 mg by mouth as needed.   rosuvastatin  (CRESTOR ) 10 MG tablet Take 1 tablet (10 mg total) by mouth daily.   sitaGLIPtin  (JANUVIA ) 100 MG tablet Take 1 tablet (100 mg total) by mouth daily.   topiramate  (TOPAMAX ) 25 MG capsule Take 25 mg by mouth 2 (two) times daily.   tralokinumab  (ADBRY ) 150 MG/ML SOSY Inject 2 mLs (300 mg total) into the skin every 14 (fourteen) days. Starting on day 15 for maintenance.   valsartan  (DIOVAN ) 40 MG tablet Take 1 tablet (40 mg total) by mouth daily.   Facility-Administered Medications Prior to Visit  Medication Dose Route Frequency Provider   dupilumab  (DUPIXENT ) prefilled syringe 600 mg  600 mg Subcutaneous Q14 Days Claudene Lehmann, MD    Review of Systems  All other systems reviewed and are negative.  All negative Except see HPI   {Insert previous labs (optional):23779} {See past labs  Heme  Chem  Endocrine  Serology  Results Review (optional):1}   Objective    LMP 04/06/2024  {Insert last BP/Wt (optional):23777}{See vitals history (optional):1}   Physical Exam Vitals reviewed.  Constitutional:      General: She is not in acute distress.    Appearance: Normal appearance. She is well-developed. She is not diaphoretic.  HENT:     Head: Normocephalic and atraumatic.  Eyes:     General: No scleral icterus.    Conjunctiva/sclera: Conjunctivae normal.  Neck:     Thyroid: No thyromegaly.  Cardiovascular:     Rate and Rhythm: Normal rate and regular rhythm.     Pulses: Normal pulses.     Heart sounds: Normal heart sounds. No murmur heard. Pulmonary:     Effort: Pulmonary effort is  normal. No respiratory distress.     Breath sounds: Normal breath sounds. No wheezing, rhonchi or rales.  Musculoskeletal:     Cervical back: Neck supple.     Right lower leg: No edema.     Left lower leg: No edema.  Lymphadenopathy:     Cervical: No cervical adenopathy.  Skin:    General: Skin is warm and dry.     Findings: No rash.  Neurological:     Mental Status: She is alert and oriented to person, place, and time. Mental status is  at baseline.  Psychiatric:        Mood and Affect: Mood normal.        Behavior: Behavior normal.      No results found for any visits on 05/17/24.      Assessment & Plan Abdominal pain and stiffness with dysmenorrhea Intense abdominal pain and stiffness, worsened during menstruation. Differential includes gastrointestinal and gynecological causes. - Referred to gastroenterology for further evaluation. - Advised follow-up with OB GYN for dysmenorrhea management.  Hypertension Chronic and stable on valsartan  Current antihypertensive causing abdominal pain and bloating. Blood pressure management complicated by gastrointestinal side effects. - Discontinued current antihypertensive medication. If abdominal pain will not subside, will restart medication - Provided blood pressure monitoring device for home use. - Scheduled follow-up in six weeks to reassess management.  Type 2 diabetes mellitus Chronic and stable Well-managed with current medication regimen. - Sent prescription for blood glucose monitoring device to pharmacy. Encouraged low cholesterol diet and daily exercise Will follow-up  Hyperlipidemia Chronic Well-managed with current medication regimen. - Sent refill for cholesterol medication to pharmacy. Encouraged low cholesterol diet Will follow-up   1. Hypertension associated with diabetes (HCC) (Primary) *** - AMB Referral VBCI Care Management  2. Chronic abdominal pain ***  3. Hyperlipidemia associated with type 2  diabetes mellitus (HCC) *** - AMB Referral VBCI Care Management  4. Diabetes mellitus due to underlying condition with other circulatory complication, without long-term current use of insulin (HCC) *** - Urine Microalbumin w/creat. ratio - Blood Glucose Monitoring Suppl DEVI; 1 each by Does not apply route as directed. Dispense based on patient and insurance preference. Use up to four times daily as directed. (FOR ICD-10 E10.9, E11.9).  Dispense: 1 each; Refill: 0 - Glucose Blood (BLOOD GLUCOSE TEST STRIPS) STRP; 1 each by Does not apply route as directed. Dispense based on patient and insurance preference. Use up to four times daily as directed. (FOR ICD-10 E10.9, E11.9).  Dispense: 100 strip; Refill: 0 - Lancet Device MISC; 1 each by Does not apply route as directed. Dispense based on patient and insurance preference. Use up to four times daily as directed. (FOR ICD-10 E10.9, E11.9).  Dispense: 1 each; Refill: 0 - Lancets MISC; 1 each by Does not apply route as directed. Dispense based on patient and insurance preference. Use up to four times daily as directed. (FOR ICD-10 E10.9, E11.9).  Dispense: 100 each; Refill: 0 - AMB Referral VBCI Care Management  5. Chronic pain of right knee ***  6. Other atopic dermatitis ***  7. Paresthesia ***  8. Encounter for screening mammogram for malignant neoplasm of breast ***  9. Breast cancer screening by mammogram *** - MM 3D Screening Breast Bilateral; Future  10. Dysmenorrhea ***   Language barrier  No orders of the defined types were placed in this encounter.   No follow-ups on file.   The patient was advised to call back or seek an in-person evaluation if the symptoms worsen or if the condition fails to improve as anticipated.  I discussed the assessment and treatment plan with the patient. The patient was provided an opportunity to ask questions and all were answered. The patient agreed with the plan and demonstrated an  understanding of the instructions.  I, Matraca Hunkins, PA-C have reviewed all documentation for this visit. The documentation on 05/17/2024  for the exam, diagnosis, procedures, and orders are all accurate and complete.  Jolynn Spencer, [facility], MMS [facility] [phone] (phone) [phone] (fax)  [facility]

## 2024-05-16 ENCOUNTER — Telehealth: Payer: Self-pay

## 2024-05-16 DIAGNOSIS — E1159 Type 2 diabetes mellitus with other circulatory complications: Secondary | ICD-10-CM

## 2024-05-16 NOTE — Telephone Encounter (Signed)
 Copied from CRM #[id number]. Topic: General - Other >> May 16, 2024 12:46 PM Lauraine BROCKS wrote: Reason for CRM: Pt is scheduled for CT 05/23/24. Please put in order for BUN & Creatinine to be done at [facility] before CT

## 2024-05-16 NOTE — Telephone Encounter (Signed)
 Order has been placed.

## 2024-05-17 ENCOUNTER — Ambulatory Visit

## 2024-05-17 ENCOUNTER — Ambulatory Visit: Admitting: Physician Assistant

## 2024-05-17 ENCOUNTER — Telehealth: Payer: Self-pay

## 2024-05-17 ENCOUNTER — Encounter: Payer: Self-pay | Admitting: Physician Assistant

## 2024-05-17 VITALS — BP 126/81 | HR 79 | Resp 14 | Ht 65.0 in | Wt 179.5 lb

## 2024-05-17 DIAGNOSIS — E0859 Diabetes mellitus due to underlying condition with other circulatory complications: Secondary | ICD-10-CM

## 2024-05-17 DIAGNOSIS — E1169 Type 2 diabetes mellitus with other specified complication: Secondary | ICD-10-CM

## 2024-05-17 DIAGNOSIS — R202 Paresthesia of skin: Secondary | ICD-10-CM

## 2024-05-17 DIAGNOSIS — N946 Dysmenorrhea, unspecified: Secondary | ICD-10-CM

## 2024-05-17 DIAGNOSIS — E785 Hyperlipidemia, unspecified: Secondary | ICD-10-CM

## 2024-05-17 DIAGNOSIS — L2089 Other atopic dermatitis: Secondary | ICD-10-CM

## 2024-05-17 DIAGNOSIS — I152 Hypertension secondary to endocrine disorders: Secondary | ICD-10-CM

## 2024-05-17 DIAGNOSIS — Z7984 Long term (current) use of oral hypoglycemic drugs: Secondary | ICD-10-CM

## 2024-05-17 DIAGNOSIS — Z1231 Encounter for screening mammogram for malignant neoplasm of breast: Secondary | ICD-10-CM

## 2024-05-17 DIAGNOSIS — G8929 Other chronic pain: Secondary | ICD-10-CM

## 2024-05-17 DIAGNOSIS — Z758 Other problems related to medical facilities and other health care: Secondary | ICD-10-CM

## 2024-05-17 LAB — POCT GLYCOSYLATED HEMOGLOBIN (HGB A1C): Hemoglobin A1C: 6.8 % — AB (ref 4.0–5.6)

## 2024-05-17 MED ORDER — DAPAGLIFLOZIN PROPANEDIOL 10 MG PO TABS: 10.0000 mg | ORAL_TABLET | Freq: Every day | ORAL | 3 refills | Status: DC

## 2024-05-17 MED ORDER — BLOOD GLUCOSE MONITORING SUPPL DEVI: 1.0000 | 0 refills | Status: AC

## 2024-05-17 MED ORDER — LANCET DEVICE MISC: 1.0000 | 0 refills | Status: AC

## 2024-05-17 MED ORDER — LANCETS MISC: 1.0000 | 0 refills | Status: AC

## 2024-05-17 MED ORDER — ROSUVASTATIN CALCIUM 10 MG PO TABS: 10.0000 mg | ORAL_TABLET | Freq: Every day | ORAL | 3 refills | Status: DC

## 2024-05-17 MED ORDER — BLOOD GLUCOSE TEST VI STRP: 1.0000 | ORAL_STRIP | 0 refills | Status: AC

## 2024-05-17 NOTE — Progress Notes (Signed)
 Complex Care Management Care Guide Note  05/17/2024 Name: [name] MRN: [id number] DOB: [birth year]  Kirsten Mccann is a 48 y.o. year old female who is a primary care patient of Ostwalt, Janna, PA-C and is actively engaged with the care management team. I reached out to [employer] by phone today to assist with re-scheduling  with the Pharmacist.  Follow up plan: Unsuccessful telephone outreach attempt made. A HIPAA compliant phone message was left for the patient providing contact information and requesting a return call.  Leotis Rase [facility], [facility] Guide  Direct Dial: [phone]  Fax [phone]

## 2024-05-17 NOTE — Progress Notes (Signed)
 S:     Reason for visit: ?  Kirsten Mccann is a 48 y.o. female with a history of diabetes (type 2), who presents today for an initial diabetes Face to Face pharmacotherapy visit.? Pertinent PMH also includes HTN, HLD, PTSD, obesity, anxiety, depression.  Care Team: Primary Care Provider: Ostwalt, Janna, PA-C  At last visit with PCP on 04/11/24, BP was elevated at 142/79 mmHg. Patient was started on valsartan  40 mg daily.  Today, she reported some GI upset with valsartan  therapy and this was stopped by PCP. She presented to clinic with her husband and son. Visit was conducted with an interpreter. Of note, she did report she had been previously taking topiramate  for headaches, however, she ran out of this about one month ago. She reports she has been having headaches since she had stopped therapy.  Current diabetes medications include: glipizide  5 mg daily, Januvia  100 mg daily  Current hypertension medications include: valsartan  40 mg daily  Current hyperlipidemia medications include: rosuvastatin  10 mg daily   Patient reports adherence to taking all medications as prescribed.   Have you been experiencing any side effects to the medications prescribed? Yes - reports GI upset that she attributes valsartan  Do you have any problems obtaining medications due to transportation or finances? no Insurance coverage: [place] Medicaid  Patient denies hypoglycemic events.  Reported home fasting blood sugars: not checking  DM Prevention:  Statin: Taking; moderate intensity.?  Last urinary albumin/creatinine ratio:  Lab Results  Component Value Date   MICRALBCREAT <8 06/30/2022   Last eye exam:  Lab Results  Component Value Date   HMDIABEYEEXA No Retinopathy 10/19/2023   Lab Results  Component Value Date   HMDIABEYEEXA No Retinopathy 10/19/2023   Last foot exam: No foot exam found Tobacco Use:  Tobacco Use: Low Risk  (05/17/2024)   Patient History    Smoking Tobacco Use: Never     Smokeless Tobacco Use: Never    Passive Exposure: Not on file   O:   Vitals:  Wt Readings from Last 3 Encounters:  05/17/24 179 lb 8 oz (81.4 kg)  04/27/24 175 lb 9.6 oz (79.7 kg)  04/11/24 177 lb (80.3 kg)   BP Readings from Last 3 Encounters:  05/17/24 126/81  04/27/24 124/78  04/11/24 (!) 142/79   Pulse Readings from Last 3 Encounters:  05/17/24 79  04/27/24 (!) 49  04/11/24 70     Labs:?  Lab Results  Component Value Date   HGBA1C 7.9 12/28/2023   HGBA1C 8.2 (H) 06/29/2023   HGBA1C 7.0 (H) 02/04/2023   GLUCOSE 110 (H) 03/28/2024   MICRALBCREAT <8 06/30/2022   CREATININE 0.77 03/28/2024   CREATININE 0.77 03/08/2024   CREATININE 0.57 12/26/2023    Lab Results  Component Value Date   CHOL 124 12/28/2023   LDLCALC 58 12/28/2023   LDLCALC 127 (H) 06/29/2023   LDLCALC 125 (H) 02/04/2023   HDL 32 (A) 12/28/2023   TRIG 171 (A) 12/28/2023   TRIG 359 (H) 06/29/2023   TRIG 261 (H) 02/04/2023   ALT 18 03/08/2024   ALT 24 12/26/2023   AST 16 03/08/2024   AST 20 12/26/2023      Chemistry      Component Value Date/Time   NA 139 03/28/2024 1826   NA 140 03/08/2024 0946   K 3.6 03/28/2024 1826   CL 107 03/28/2024 1826   CO2 22 03/28/2024 1826   BUN 15 03/28/2024 1826   BUN 14 03/08/2024 0946   CREATININE  0.77 03/28/2024 1826      Component Value Date/Time   CALCIUM  9.3 03/28/2024 1826   ALKPHOS 65 03/08/2024 0946   AST 16 03/08/2024 0946   ALT 18 03/08/2024 0946   BILITOT 0.4 03/08/2024 0946       The ASCVD Risk score (Arnett DK, et al., 2019) failed to calculate for the following reasons:   The valid total cholesterol range is 130 to 320 mg/dL  Lab Results  Component Value Date   MICRALBCREAT <8 06/30/2022    A/P: Diabetes currently controlled with a most recent A1c of 6.8% on 05/17/24, which is down from 7.9% on 12/28/23. Patient is able to verbalize appropriate hypoglycemia management plan. Medication adherence appears appropriate. Given level of  control and risk of hypoglycemia with lack of BG monitoring, will switch SFU to a SGLT2i therapy.  -Continued DPP4i Januvia  100 mg daily.  -Discontinued glipizide  -Started SGLT2-I Farxiga  (dapagliflozin )10 mg daily.  -Patient educated on purpose, proper use, and potential adverse effects of Farxiga .  -Extensively discussed pathophysiology of diabetes, recommended lifestyle interventions, dietary effects on blood sugar control.  -Counseled on s/sx of and management of hypoglycemia.  -PCP sent Rx today for BG monitoring supplies. Encouraged patient to bring to follow up for education on use  ASCVD risk - primary prevention in patient with diabetes. Last LDL is 58 mg/dL, at goal of <29 mg/dL. -Continued rosuvastatin  10 mg daily.   Hypertension longstanding currently controlled. Blood pressure goal of <130/80 mmHg. Patient reports GI upset with valsartan . PCP planned to hold for a month to determine if symptoms resolve. -HOLD valsartan  40 mg daily. -Will send BP monitoring supplies to DME company  Headaches: Informed PCP of topiramate  nonadherence and previous use. Could benefit from re-initiation.  Patient verbalized understanding of treatment plan. Total time patient counseling 60 minutes.  Follow-up:  Pharmacist on 06/21/24 PCP clinic visit on 07/11/24  [name], PharmD, CPP Clinical Pharmacist [facility] [phone]

## 2024-05-22 ENCOUNTER — Other Ambulatory Visit: Payer: Self-pay

## 2024-05-22 DIAGNOSIS — I152 Hypertension secondary to endocrine disorders: Secondary | ICD-10-CM

## 2024-05-22 MED ORDER — BLOOD PRESSURE KIT DEVI: 0 refills | Status: DC

## 2024-05-23 ENCOUNTER — Ambulatory Visit: Attending: Physician Assistant

## 2024-05-31 MED ORDER — BLOOD PRESSURE KIT DEVI: 0 refills | Status: AC

## 2024-06-02 ENCOUNTER — Telehealth: Payer: Self-pay

## 2024-06-02 NOTE — Progress Notes (Signed)
 Care Guide Pharmacy Note  06/02/2024 Name: Dannilynn Gallina MRN: [id number] DOB: 10/21/1975  Referred By: Ostwalt, Janna, PA-C Reason for referral: Complex Care Management and Call Attempt #1 (Unsuccessful initial outreach to schedule with PHARM D- Allyson)   Megan Feltman is a 48 y.o. year old female who is a primary care patient of Ostwalt, Janna, PA-C.  Charmelle Hartis was referred to the pharmacist for assistance related to: HTN  An unsuccessful telephone outreach was attempted today to contact the patient who was referred to the pharmacy team for assistance with Disease management. Additional attempts will be made to contact the patient.  Leotis Rase [facility], [facility] Guide  Direct Dial: [phone]  Fax [phone]

## 2024-06-05 ENCOUNTER — Telehealth: Payer: Self-pay

## 2024-06-05 NOTE — Telephone Encounter (Signed)
 Copied from CRM #[id number]. Topic: Clinical - Prescription Issue >> Jun 05, 2024 10:25 AM Terri MATSU wrote: Reason for CRM: Tamera from [facility] calling regarding a prescription was faxed to them for patient but fax showed up dark and they can't really read it so if you can fax it to them again. She think its patient Blood Pressure Monitoring (BLOOD PRESSURE KIT)

## 2024-06-06 ENCOUNTER — Ambulatory Visit

## 2024-06-06 DIAGNOSIS — L209 Atopic dermatitis, unspecified: Secondary | ICD-10-CM | POA: Diagnosis not present

## 2024-06-06 MED ORDER — TRALOKINUMAB-LDRM 150 MG/ML ~~LOC~~ SOSY
300.0000 mg | PREFILLED_SYRINGE | Freq: Once | SUBCUTANEOUS | Status: AC
  Administered 2024-06-06: 300 mg via SUBCUTANEOUS

## 2024-06-06 NOTE — Progress Notes (Signed)
 Patient here today for Adbry  injection loading dose (patient supplied) for atopic dermatitis.   Adbry  150mg /mL x2 injected into right upper arm. Patient tolerated injection well.   NDC: 49777-653-77 Lot: [id number] Exp: 09/2025  Marleen Moret V. Wilfred, CMA

## 2024-06-14 NOTE — Telephone Encounter (Signed)
 Contacted son to provide update that BP supplies were sent to [facility]. Provided phone number to pharmacy.  Rishikesh Khachatryan E. Marsh, PharmD, CPP Clinical Pharmacist [facility] [phone]

## 2024-06-20 ENCOUNTER — Ambulatory Visit: Admitting: Certified Nurse Midwife

## 2024-06-20 ENCOUNTER — Ambulatory Visit

## 2024-06-21 ENCOUNTER — Ambulatory Visit

## 2024-06-21 ENCOUNTER — Other Ambulatory Visit (HOSPITAL_COMMUNITY): Payer: Self-pay

## 2024-06-21 VITALS — BP 116/75 | HR 73

## 2024-06-21 DIAGNOSIS — E1169 Type 2 diabetes mellitus with other specified complication: Secondary | ICD-10-CM

## 2024-06-21 DIAGNOSIS — Z7984 Long term (current) use of oral hypoglycemic drugs: Secondary | ICD-10-CM | POA: Diagnosis not present

## 2024-06-21 DIAGNOSIS — E785 Hyperlipidemia, unspecified: Secondary | ICD-10-CM

## 2024-06-21 DIAGNOSIS — E0859 Diabetes mellitus due to underlying condition with other circulatory complications: Secondary | ICD-10-CM

## 2024-06-21 MED ORDER — ROSUVASTATIN CALCIUM 10 MG PO TABS
10.0000 mg | ORAL_TABLET | Freq: Every day | ORAL | 3 refills | Status: AC
  Filled 2024-06-21: qty 90, 90d supply, fill #0
  Filled ????-??-??: fill #0

## 2024-06-21 MED ORDER — SITAGLIPTIN PHOSPHATE 100 MG PO TABS
100.0000 mg | ORAL_TABLET | Freq: Every day | ORAL | 3 refills | Status: AC
  Filled 2024-06-21: qty 90, 90d supply, fill #0
  Filled 2024-06-23: qty 30, 30d supply, fill #0
  Filled ????-??-??: fill #1

## 2024-06-21 MED ORDER — DAPAGLIFLOZIN PROPANEDIOL 10 MG PO TABS
10.0000 mg | ORAL_TABLET | Freq: Every day | ORAL | 3 refills | Status: AC
  Filled 2024-06-21 – 2024-06-22 (×2): qty 90, 90d supply, fill #0
  Filled 2024-06-23: qty 30, 30d supply, fill #0
  Filled ????-??-??: fill #1

## 2024-06-21 NOTE — Addendum Note (Signed)
 Addended by: MARSH, Felesha Moncrieffe E on: 06/21/2024 06:05 PM   Modules accepted: Level of Service

## 2024-06-21 NOTE — Patient Instructions (Addendum)
 Januvia  100 mg daily Rosuvastatin  10 mg daily Farxiga  10 mg daily

## 2024-06-21 NOTE — Progress Notes (Signed)
 "  S:     Reason for visit: ?  Kirsten Mccann is a 49 y.o. female with a history of diabetes (type 2), who presents today for a follow up diabetes Face to Face pharmacotherapy visit.? Pertinent PMH also includes HTN, HLD, PTSD, obesity, anxiety, depression.  They were referred to the pharmacist by their PCP for assistance in managing diabetes and hypertension.   Care Team: Primary Care Provider: Ostwalt, Janna, PA-C  At last visit with PCP on 04/11/24, BP was elevated at 142/79 mmHg. Patient was started on valsartan  40 mg daily.  At last visit on 05/17/24, patient was instructed to discontinue glipizide  and initiate Farxiga  10 mg daily. Valsartan  was placed on hold by PCP at that time.   Today, patient presented to clinic with her husband. Visit was conducted with a nurse, learning disability. It was found during the visit that the patient was confused as to which medications she is supposed to take and had self-discontinued all medications aside from rosuvastatin . She has also not yet picked up Farxiga  from the pharmacy. She reported home BG readings were elevated as a result.   Current diabetes medications include: Farxiga  10 mg daily (not started), Januvia  100 mg daily (not taking) Previous diabetes medications include: glipizide  Current hyperlipidemia medications include: rosuvastatin  10 mg daily   Patient reports adherence to rosuvastatin  only.   Have you been experiencing any side effects to the medications prescribed? No Do you have any problems obtaining medications due to transportation or finances? no Insurance coverage: [place] [employer]  Patient denies hypoglycemic events.  Reported home fasting blood sugars: 132-239 mg/dL    DM Prevention:  Statin: Taking; moderate intensity.?  Last urinary albumin/creatinine ratio:  Lab Results  Component Value Date   MICRALBCREAT <8 06/30/2022   Last eye exam:  Lab Results  Component Value Date   HMDIABEYEEXA No Retinopathy 10/19/2023   Lab Results   Component Value Date   HMDIABEYEEXA No Retinopathy 10/19/2023   Last foot exam: No foot exam found Tobacco Use:  Tobacco Use: Low Risk  (06/01/2024)   Received from [facility]   Patient History    Smoking Tobacco Use: Never    Smokeless Tobacco Use: Never    Passive Exposure: Not on file   O:   Vitals:  Wt Readings from Last 3 Encounters:  05/17/24 179 lb 8 oz (81.4 kg)  04/27/24 175 lb 9.6 oz (79.7 kg)  04/11/24 177 lb (80.3 kg)   BP Readings from Last 3 Encounters:  05/17/24 126/81  04/27/24 124/78  04/11/24 (!) 142/79   Pulse Readings from Last 3 Encounters:  05/17/24 79  04/27/24 (!) 49  04/11/24 70     Labs:?  Lab Results  Component Value Date   HGBA1C 6.8 (A) 05/17/2024   HGBA1C 7.9 12/28/2023   HGBA1C 8.2 (H) 06/29/2023   GLUCOSE 110 (H) 03/28/2024   MICRALBCREAT <8 06/30/2022   CREATININE 0.77 03/28/2024   CREATININE 0.77 03/08/2024   CREATININE 0.57 12/26/2023    Lab Results  Component Value Date   CHOL 124 12/28/2023   LDLCALC 58 12/28/2023   LDLCALC 127 (H) 06/29/2023   LDLCALC 125 (H) 02/04/2023   HDL 32 (A) 12/28/2023   TRIG 171 (A) 12/28/2023   TRIG 359 (H) 06/29/2023   TRIG 261 (H) 02/04/2023   ALT 18 03/08/2024   ALT 24 12/26/2023   AST 16 03/08/2024   AST 20 12/26/2023      Chemistry      Component Value  Date/Time   NA 139 03/28/2024 1826   NA 140 03/08/2024 0946   K 3.6 03/28/2024 1826   CL 107 03/28/2024 1826   CO2 22 03/28/2024 1826   BUN 15 03/28/2024 1826   BUN 14 03/08/2024 0946   CREATININE 0.77 03/28/2024 1826      Component Value Date/Time   CALCIUM  9.3 03/28/2024 1826   ALKPHOS 65 03/08/2024 0946   AST 16 03/08/2024 0946   ALT 18 03/08/2024 0946   BILITOT 0.4 03/08/2024 0946       The ASCVD Risk score (Arnett DK, et al., 2019) failed to calculate for the following reasons:   The valid total cholesterol range is 130 to 320 mg/dL   * - Cholesterol units were assumed  Lab Results   Component Value Date   MICRALBCREAT <8 06/30/2022    A/P: Diabetes currently controlled with a most recent A1c of 6.8% on 05/17/24, which is down from 7.9% on 12/28/23. Fasting BG has been elevated since last visit as she has not been taking any antidiabetic medications. -Restarted DPP4i Januvia  100 mg daily.  -Started SGLT2-I Farxiga  (dapagliflozin )10 mg daily.  -Patient educated on purpose, proper use, and potential adverse effects of Farxiga .  -Extensively discussed pathophysiology of diabetes, recommended lifestyle interventions, dietary effects on blood sugar control.  -Counseled on s/sx of and management of hypoglycemia.    ASCVD risk - primary prevention in patient with diabetes. Last LDL is 58 mg/dL, at goal of <29 mg/dL. -Continued rosuvastatin  10 mg daily.   Hypertension longstanding currently controlled off therapies. Blood pressure goal of <130/80 mmHg.   Medication Management: - Currently strategy insufficient to maintain appropriate adherence to prescribed medication regimen - Created list of medication, indication, and administration time. Provided to patient - Discussed collaboration with local pharmacies for adherence packaging. Reviewed local pharmacies with adherence packaging options. Patient elects to use [facility] pharmacies  Patient verbalized understanding of treatment plan. Total time patient counseling 60 minutes.  Follow-up:  Pharmacist on 07/25/24 PCP clinic visit on 07/11/24  Kirsten Mccann, PharmD, CPP Clinical Pharmacist [facility] [phone]   "

## 2024-06-22 ENCOUNTER — Telehealth (HOSPITAL_COMMUNITY): Payer: Self-pay

## 2024-06-22 ENCOUNTER — Other Ambulatory Visit: Payer: Self-pay

## 2024-06-22 ENCOUNTER — Other Ambulatory Visit (HOSPITAL_COMMUNITY): Payer: Self-pay

## 2024-06-22 ENCOUNTER — Ambulatory Visit (INDEPENDENT_AMBULATORY_CARE_PROVIDER_SITE_OTHER)

## 2024-06-22 DIAGNOSIS — L209 Atopic dermatitis, unspecified: Secondary | ICD-10-CM

## 2024-06-22 MED ORDER — TRALOKINUMAB-LDRM 300 MG/2ML ~~LOC~~ SOAJ
300.0000 mg | Freq: Once | SUBCUTANEOUS | Status: AC
  Administered 2024-06-22: 300 mg via SUBCUTANEOUS

## 2024-06-22 NOTE — Telephone Encounter (Signed)
 PA request has been Received. New Encounter has been or will be created for follow up. For additional info see Pharmacy Prior Auth telephone encounter from 06/22/24.

## 2024-06-22 NOTE — Telephone Encounter (Signed)
 Pharmacy Patient Advocate Encounter   Received notification from Pt Calls Messages that prior authorization for Farxiga  10MG  tablets  is required/requested.   Insurance verification completed.   The patient is insured through [employer].   Per test claim: PA required; PA submitted to above mentioned insurance via Latent Key/confirmation #/EOC [id number] Status is pending

## 2024-06-22 NOTE — Progress Notes (Signed)
 Patient here today for Adbry  injection lfor atopic dermatitis.    Adbry  300mg  injected into left upper arm. Patient tolerated injection well. Patient injection for this week sat our too long at room tempeture and sample used today. aw   NDC: [id number] Lot: [id number] Exp: 07/2026   Alan Pizza, RMA

## 2024-06-22 NOTE — Telephone Encounter (Signed)
 Pharmacy Patient Advocate Encounter  Received notification from [employer] that Prior Authorization for Farxiga  10MG  tablets  has been APPROVED from 06/22/24 to 06/22/25. Ran test claim, Copay is $4. This test claim was processed through [facility]- copay amounts may vary at other pharmacies due to pharmacy/plan contracts, or as the patient moves through the different stages of their insurance plan.   PA #/Case ID/Reference #: [id number]

## 2024-06-23 ENCOUNTER — Ambulatory Visit: Payer: Self-pay | Admitting: Pharmacist

## 2024-06-23 ENCOUNTER — Other Ambulatory Visit: Payer: Self-pay

## 2024-06-23 NOTE — Progress Notes (Unsigned)
 Spoke to son , Hamir .  Informed him about enrolling Kalya into adherence packs.   He will be calling us  back with CC info Check back in Feb  for 90 days supply, CC info

## 2024-06-26 ENCOUNTER — Other Ambulatory Visit: Payer: Self-pay

## 2024-06-26 ENCOUNTER — Encounter: Payer: Self-pay | Admitting: Pharmacist

## 2024-06-26 ENCOUNTER — Other Ambulatory Visit (HOSPITAL_COMMUNITY): Payer: Self-pay

## 2024-06-28 ENCOUNTER — Other Ambulatory Visit: Payer: Self-pay

## 2024-07-06 ENCOUNTER — Ambulatory Visit

## 2024-07-06 DIAGNOSIS — L209 Atopic dermatitis, unspecified: Secondary | ICD-10-CM | POA: Diagnosis not present

## 2024-07-06 MED ORDER — TRALOKINUMAB-LDRM 150 MG/ML ~~LOC~~ SOSY
300.0000 mg | PREFILLED_SYRINGE | Freq: Once | SUBCUTANEOUS | Status: AC
  Administered 2024-07-06: 300 mg via SUBCUTANEOUS

## 2024-07-06 NOTE — Progress Notes (Signed)
 Patient here today for Adbry  injection loading dose (patient supplied) for atopic dermatitis.    Adbry  150mg /mL x2 injected into left upper arm. Patient tolerated injection well.    NDC: 49777-653-77 Lot: [id number] Exp: 09/2025   Alan Pizza, RMA

## 2024-07-10 NOTE — Progress Notes (Unsigned)
 " Established patient visit  Patient: Kirsten Mccann   DOB: 06/30/1975   49 y.o. Female  MRN: [id number] Visit Date: 07/11/2024  Today's healthcare provider: Jolynn Spencer, PA-C   No chief complaint on file.  Subjective       Discussed the use of AI scribe software for clinical note transcription with the patient, who gave verbal consent to proceed.  History of Present Illness Kirsten Mccann is a 49 year old female who presents for management of her chronic conditions.  She is treated for diabetes with Farxiga  in the morning and Januvia  in the evening and for hyperlipidemia with a cholesterol medication at bedtime. She has difficulty obtaining some medications from the pharmacy, especially those from specialists. She has hypertension and was previously on valsartan  but could not tolerate it. She does not have a blood pressure cuff at home, has not received one as expected, and cannot easily afford to buy one.  She reports memory problems such as forgetting tasks and has difficulty seeing in low light. She notes bleeding gums and other dental issues.  She follows with gastroenterology for abdominal pain and heartburn and takes medication for these symptoms, with a planned GI follow-up.  She was seen by neurology for headaches and prescribed a medication that she has been unable to obtain from the pharmacy. She has stopped trying to get this medication because of these access problems.  She is the sole financial provider for her family, which limits her ability to purchase medical devices such as a home blood pressure cuff and may affect her ability to obtain medications.  Gyn Gi - Patient with reduced frequency of headaches about head, 5/10 pain occurring once every 2 weeks. Reduced frequency of dizziness. Feels Topamax  25 mg BID is causing headaches and dizziness.   - Continue Maxalt  (Rizatriptan ) 10 mg as needed for headache rescue.Take another tablet in 2 hours if needed. Take no more than  2 tablets in a 24 hour period. Refill. - Do not refill Topamax  25 mg twice daily. Patient feels her headaches worsened on this and caused dizziness.   Mohd abdul rahman [id number]    04/03/2024    1:51 PM 10/25/2023    5:09 PM 09/15/2022   10:30 AM  Depression screen PHQ 2/9  Decreased Interest   3  Down, Depressed, Hopeless 0  2  PHQ - 2 Score 0  5  Altered sleeping 3  2  Tired, decreased energy 2  1  Change in appetite 0  0  Feeling bad or failure about yourself  0  2  Trouble concentrating 0  0  Moving slowly or fidgety/restless 0  1  Suicidal thoughts 0  0  PHQ-9 Score 5   11   Difficult doing work/chores Not difficult at all  Not difficult at all     Information is confidential and restricted. Go to Review Flowsheets to unlock data.   Data saved with a previous flowsheet row definition      04/03/2024    1:52 PM 10/25/2023    5:09 PM 09/15/2022   10:43 AM  GAD 7 : Generalized Anxiety Score  Nervous, Anxious, on Edge 1   3   Control/stop worrying 3   3   Worry too much - different things 2   3   Trouble relaxing 0   0   Restless 2   3   Easily annoyed or irritable 0   3   Afraid - awful might happen 0  3   Total GAD 7 Score 8  18  Anxiety Difficulty Not difficult at all  Somewhat difficult     Information is confidential and restricted. Go to Review Flowsheets to unlock data.   Data saved with a previous flowsheet row definition    Medications: Show/hide medication list[1]  Review of Systems  All other systems reviewed and are negative.  All negative Except see HPI   {Insert previous labs (optional):23779} {See past labs  Heme  Chem  Endocrine  Serology  Results Review (optional):1}   Objective    There were no vitals taken for this visit. {Insert last BP/Wt (optional):23777}{See vitals history (optional):1}   Physical Exam Vitals reviewed.  Constitutional:      General: She is not in acute distress.    Appearance: Normal appearance. She is  well-developed. She is not diaphoretic.  HENT:     Head: Normocephalic and atraumatic.  Eyes:     General: No scleral icterus.    Conjunctiva/sclera: Conjunctivae normal.  Neck:     Thyroid: No thyromegaly.  Cardiovascular:     Rate and Rhythm: Normal rate and regular rhythm.     Pulses: Normal pulses.     Heart sounds: Normal heart sounds. No murmur heard. Pulmonary:     Effort: Pulmonary effort is normal. No respiratory distress.     Breath sounds: Normal breath sounds. No wheezing, rhonchi or rales.  Musculoskeletal:     Cervical back: Neck supple.     Right lower leg: No edema.     Left lower leg: No edema.  Lymphadenopathy:     Cervical: No cervical adenopathy.  Skin:    General: Skin is warm and dry.     Findings: No rash.  Neurological:     Mental Status: She is alert and oriented to person, place, and time. Mental status is at baseline.  Psychiatric:        Mood and Affect: Mood normal.        Behavior: Behavior normal.      No results found for any visits on 07/11/24.      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 Emphasized blood glucose control to prevent complications like vision impairment and neuropathy. Regular foot care and vision monitoring crucial. - Continue Farxiga  and Januvia  as prescribed. - Apply moisturizer to feet daily after foot baths. - Monitor vision regularly.  Hypertension Blood pressure management suboptimal. Amlodipine  considered as alternative to valsartan . - Prescribed amlodipine  as alternative to valsartan . - Advised to drink plenty of water and avoid salty and spicy foods. - Instructed to contact clinic if issues arise with amlodipine . - Instructed to obtain blood pressure cuff from [facility] for home monitoring.  Hyperlipidemia Cholesterol management ongoing with medication. Emphasized importance  of continuing medication. - Continue cholesterol medication as prescribed.  Gastroesophageal reflux disease GERD management includes medication for heartburn and indigestion. Advised follow-up with gastroenterology. - Continue medication for heartburn and indigestion as needed. - Follow up with gastroenterology.  Depression and anxiety Ongoing issues with depression and anxiety. Counseling services available in clinic. - Referred to in-clinic counseling.  General health maintenance Emphasized regular follow-up and monitoring of chronic conditions. Coordination with specialists and pharmacy crucial. - Scheduled follow-up appointment in two months. - Coordinated with pharmacy for medication management and blood pressure cuff acquisition.    No orders of the defined types were placed in this encounter.  No follow-ups on file.   The patient was advised to call back or seek an in-person evaluation if the symptoms worsen or if the condition fails to improve as anticipated.  I discussed the assessment and treatment plan with the patient. The patient was provided an opportunity to ask questions and all were answered. The patient agreed with the plan and demonstrated an understanding of the instructions.  I, Mariapaula Krist, PA-C have reviewed all documentation for this visit. The documentation on 07/11/2024  for the exam, diagnosis, procedures, and orders are all accurate and complete.  Jolynn Spencer, [facility], MMS [facility] [phone] (phone) [phone] (fax)  [facility]     [1]  Outpatient Medications Prior to Visit  Medication Sig   Blood Glucose Monitoring Suppl DEVI 1 each by Does not apply route as directed. Dispense based on patient and insurance preference. Use up to four times daily as directed. (FOR ICD-10 E10.9, E11.9).   Blood Pressure Monitoring (BLOOD PRESSURE KIT) DEVI Use to measure blood pressure once daily   Cholecalciferol (VITAMIN  D-3) 125 MCG (5000 UT) TABS Take 1 capsule by mouth daily.   clobetasol  cream (TEMOVATE ) 0.05 % APPLY 1G TWICE DAILY TO AFFECTED BODY AREAS UNTIL SMOOTH. AVOID APPLYING TO FACE, GROIN, AND AXILLA   dapagliflozin  propanediol (FARXIGA ) 10 MG TABS tablet Take 1 tablet (10 mg total) by mouth daily.   Glucose Blood (BLOOD GLUCOSE TEST STRIPS) STRP 1 each by Does not apply route as directed. Dispense based on patient and insurance preference. Use up to four times daily as directed. (FOR ICD-10 E10.9, E11.9).   Lancet Device MISC 1 each by Does not apply route as directed. Dispense based on patient and insurance preference. Use up to four times daily as directed. (FOR ICD-10 E10.9, E11.9).   Lancets MISC 1 each by Does not apply route as directed. Dispense based on patient and insurance preference. Use up to four times daily as directed. (FOR ICD-10 E10.9, E11.9).   rizatriptan  (MAXALT ) 5 MG tablet Take 5 mg by mouth as needed. (Patient not taking: Reported on 05/17/2024)   rosuvastatin  (CRESTOR ) 10 MG tablet Take 1 tablet (10 mg total) by mouth daily.   sitaGLIPtin  (JANUVIA ) 100 MG tablet Take 1 tablet (100 mg total) by mouth daily.   tralokinumab  (ADBRY ) 150 MG/ML SOSY Inject 2 mLs (300 mg total) into the skin every 14 (fourteen) days. Starting on day 15 for maintenance. (Patient not taking: Reported on 05/17/2024)   No facility-administered medications prior to visit.   "

## 2024-07-11 ENCOUNTER — Ambulatory Visit: Admitting: Physician Assistant

## 2024-07-11 ENCOUNTER — Encounter: Payer: Self-pay | Admitting: Physician Assistant

## 2024-07-11 VITALS — BP 140/83 | HR 75 | Wt 179.0 lb

## 2024-07-11 DIAGNOSIS — R109 Unspecified abdominal pain: Secondary | ICD-10-CM

## 2024-07-11 DIAGNOSIS — I152 Hypertension secondary to endocrine disorders: Secondary | ICD-10-CM

## 2024-07-11 DIAGNOSIS — Z603 Acculturation difficulty: Secondary | ICD-10-CM

## 2024-07-11 DIAGNOSIS — Z23 Encounter for immunization: Secondary | ICD-10-CM | POA: Diagnosis not present

## 2024-07-11 DIAGNOSIS — F32A Depression, unspecified: Secondary | ICD-10-CM | POA: Diagnosis not present

## 2024-07-11 DIAGNOSIS — E1159 Type 2 diabetes mellitus with other circulatory complications: Secondary | ICD-10-CM

## 2024-07-11 DIAGNOSIS — E1169 Type 2 diabetes mellitus with other specified complication: Secondary | ICD-10-CM

## 2024-07-11 DIAGNOSIS — Z1159 Encounter for screening for other viral diseases: Secondary | ICD-10-CM

## 2024-07-11 DIAGNOSIS — Z758 Other problems related to medical facilities and other health care: Secondary | ICD-10-CM

## 2024-07-11 DIAGNOSIS — G4489 Other headache syndrome: Secondary | ICD-10-CM

## 2024-07-11 DIAGNOSIS — E119 Type 2 diabetes mellitus without complications: Secondary | ICD-10-CM

## 2024-07-11 DIAGNOSIS — Z7984 Long term (current) use of oral hypoglycemic drugs: Secondary | ICD-10-CM | POA: Diagnosis not present

## 2024-07-11 DIAGNOSIS — K219 Gastro-esophageal reflux disease without esophagitis: Secondary | ICD-10-CM

## 2024-07-11 DIAGNOSIS — E785 Hyperlipidemia, unspecified: Secondary | ICD-10-CM

## 2024-07-11 DIAGNOSIS — Z1231 Encounter for screening mammogram for malignant neoplasm of breast: Secondary | ICD-10-CM

## 2024-07-11 DIAGNOSIS — F419 Anxiety disorder, unspecified: Secondary | ICD-10-CM

## 2024-07-11 DIAGNOSIS — E66811 Obesity, class 1: Secondary | ICD-10-CM

## 2024-07-11 MED ORDER — AMLODIPINE BESYLATE 2.5 MG PO TABS: 2.5000 mg | ORAL_TABLET | Freq: Every day | ORAL | 0 refills | Status: AC

## 2024-07-12 ENCOUNTER — Other Ambulatory Visit: Payer: Self-pay

## 2024-07-12 ENCOUNTER — Other Ambulatory Visit: Payer: Self-pay | Admitting: Physician Assistant

## 2024-07-12 DIAGNOSIS — G4489 Other headache syndrome: Secondary | ICD-10-CM

## 2024-07-12 MED ORDER — RIZATRIPTAN BENZOATE 10 MG PO TABS
10.0000 mg | ORAL_TABLET | ORAL | 0 refills | Status: AC | PRN
  Filled 2024-07-12: qty 10, 14d supply, fill #0

## 2024-07-13 LAB — MICROALBUMIN / CREATININE URINE RATIO
Creatinine, Urine: 62.8 mg/dL
Microalb/Creat Ratio: 7 mg/g{creat} (ref 0–29)
Microalbumin, Urine: 4.6 ug/mL

## 2024-07-13 LAB — SPECIMEN STATUS REPORT

## 2024-07-17 ENCOUNTER — Ambulatory Visit: Payer: Self-pay | Admitting: Physician Assistant

## 2024-07-20 ENCOUNTER — Ambulatory Visit

## 2024-07-20 DIAGNOSIS — L209 Atopic dermatitis, unspecified: Secondary | ICD-10-CM | POA: Diagnosis not present

## 2024-07-20 MED ORDER — TRALOKINUMAB-LDRM 150 MG/ML ~~LOC~~ SOSY
300.0000 mg | PREFILLED_SYRINGE | Freq: Once | SUBCUTANEOUS | Status: AC
  Administered 2024-07-20: 300 mg via SUBCUTANEOUS

## 2024-07-20 NOTE — Progress Notes (Cosign Needed)
 Patient here today for Adbry  injection loading dose (patient supplied) for atopic dermatitis.    Adbry  150mg /mL x2 injected into right upper arm. Patient tolerated injection well.    NDC: 49777-653-77 Lot: [id number] Exp: 09/2025   Alan Pizza, RMA

## 2024-07-25 ENCOUNTER — Ambulatory Visit: Admitting: Dermatology

## 2024-07-26 ENCOUNTER — Ambulatory Visit

## 2024-08-03 ENCOUNTER — Ambulatory Visit

## 2024-08-21 ENCOUNTER — Institutional Professional Consult (permissible substitution): Admitting: Licensed Clinical Social Worker

## 2024-09-11 ENCOUNTER — Ambulatory Visit: Admitting: Physician Assistant
